# Patient Record
Sex: Female | Born: 1985 | Race: White | Hispanic: No | Marital: Married | Smoking: Never smoker
Health system: Southern US, Community
[De-identification: ages and names within clinical notes are randomized; demographics above are authoritative.]

## PROBLEM LIST (undated history)

## (undated) DIAGNOSIS — O139 Gestational [pregnancy-induced] hypertension without significant proteinuria, unspecified trimester: Secondary | ICD-10-CM

## (undated) DIAGNOSIS — I10 Essential (primary) hypertension: Secondary | ICD-10-CM

## (undated) DIAGNOSIS — O1490 Unspecified pre-eclampsia, unspecified trimester: Secondary | ICD-10-CM

## (undated) DIAGNOSIS — Z3490 Encounter for supervision of normal pregnancy, unspecified, unspecified trimester: Secondary | ICD-10-CM

## (undated) DIAGNOSIS — F32A Depression, unspecified: Secondary | ICD-10-CM

## (undated) DIAGNOSIS — F419 Anxiety disorder, unspecified: Secondary | ICD-10-CM

## (undated) HISTORY — PX: FOOT FRACTURE SURGERY: SHX645

## (undated) HISTORY — PX: EYE SURGERY: SHX253

## (undated) HISTORY — DX: Anxiety disorder, unspecified: F41.9

## (undated) HISTORY — PX: REFRACTIVE SURGERY: SHX103

## (undated) HISTORY — DX: Unspecified pre-eclampsia, unspecified trimester: O14.90

## (undated) HISTORY — PX: OTHER SURGICAL HISTORY: SHX169

## (undated) HISTORY — DX: Essential (primary) hypertension: I10

## (undated) HISTORY — DX: Depression, unspecified: F32.A

## (undated) HISTORY — PX: BRAIN SURGERY: SHX531

## (undated) HISTORY — DX: Encounter for supervision of normal pregnancy, unspecified, unspecified trimester: Z34.90

## (undated) HISTORY — DX: Gestational (pregnancy-induced) hypertension without significant proteinuria, unspecified trimester: O13.9

---
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
## 2004-05-10 ENCOUNTER — Other Ambulatory Visit: Admission: RE | Admit: 2004-05-10 | Discharge: 2004-05-10 | Payer: Self-pay | Admitting: Obstetrics and Gynecology

## 2004-05-10 ENCOUNTER — Other Ambulatory Visit: Admission: RE | Admit: 2004-05-10 | Discharge: 2004-05-10 | Payer: Self-pay | Admitting: Obstetrics & Gynecology

## 2005-05-24 ENCOUNTER — Other Ambulatory Visit: Admission: RE | Admit: 2005-05-24 | Discharge: 2005-05-24 | Payer: Self-pay | Admitting: Obstetrics and Gynecology

## 2006-03-29 ENCOUNTER — Ambulatory Visit (HOSPITAL_COMMUNITY): Admission: RE | Admit: 2006-03-29 | Discharge: 2006-03-29 | Payer: Self-pay | Admitting: Pediatrics

## 2008-10-31 DIAGNOSIS — F419 Anxiety disorder, unspecified: Secondary | ICD-10-CM

## 2008-10-31 HISTORY — DX: Anxiety disorder, unspecified: F41.9

## 2009-07-01 LAB — CONVERTED CEMR LAB: Pap Smear: NORMAL

## 2009-11-06 ENCOUNTER — Ambulatory Visit: Payer: Self-pay | Admitting: Family Medicine

## 2009-11-06 DIAGNOSIS — J209 Acute bronchitis, unspecified: Secondary | ICD-10-CM

## 2009-11-06 HISTORY — DX: Acute bronchitis, unspecified: J20.9

## 2009-11-09 ENCOUNTER — Ambulatory Visit: Payer: Self-pay | Admitting: Family Medicine

## 2010-11-30 NOTE — Assessment & Plan Note (Signed)
Summary: tb test reading/mm  Nurse Visit   Allergies: No Known Drug Allergies  PPD Results    Date of reading: 11/09/2009    Results: < 5mm    Interpretation: negative

## 2010-11-30 NOTE — Assessment & Plan Note (Signed)
Summary: NEW PT EST // RS   Vital Signs:  Patient profile:   25 year old female Menstrual status:  regular LMP:     11/04/2009 Height:      66.75 inches Weight:      177 pounds BMI:     28.03 Temp:     98.9 degrees F oral Pulse rate:   70 / minute Pulse rhythm:   regular Resp:     12 per minute BP sitting:   120 / 90  (left arm) Cuff size:   regular  Vitals Entered By: Sid Falcon LPN (November 06, 2009 2:22 PM)  Nutrition Counseling: Patient's BMI is greater than 25 and therefore counseled on weight management options. CC: New to establish, Tb test LMP (date): 11/04/2009     Menstrual Status regular Enter LMP: 11/04/2009 Last PAP Result normal   History of Present Illness: New patient to establish care. Basically healthy. Currently treated for bronchitis with Zithromax from another clinic. That is improving.  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 History of strabismus eye surgery 1992 1997. Foot reconstruction surgery 2006. Takes no medications. No known drug allergies.  Family history significant for hypertension and type 2 diabetes. Patient is school studying nursing. Plans to go to [place] school. Nonsmoker. Tetanus up-to-date. Her safely vaccine earlier this year. Needs PPD for nursing school.  Preventive Screening-Counseling & Management  Caffeine-Diet-Exercise     Does Patient Exercise: yes  Allergies (verified): No Known Drug Allergies  Past History:  Family History: Last updated: 11/06/2009 Family History Hypertension, parents, grandparents Stroke Diabetes, grandparent  Social History: Last updated: 11/06/2009 Occupation:  Nursing student Single Alcohol use-yes Regular exercise-yes  Past Medical History: Chicken pox  Past Surgical History: Optic glioma tumor removal 1989 Strabismus eye surgery [id number] foot reconstruction 2006  Family History: Family History Hypertension, parents, grandparents Stroke Diabetes,  grandparent  Social History: Occupation:  Theatre stage manager Single Alcohol use-yes Regular exercise-yes Occupation:  employed Does Patient Exercise:  yes  Review of Systems  The patient denies anorexia, fever, weight loss, weight gain, chest pain, syncope, dyspnea on exertion, peripheral edema, prolonged cough, headaches, hemoptysis, abdominal pain, melena, hematochezia, severe indigestion/heartburn, and hematuria.    Physical Exam  General:  Well-developed,well-nourished,in no acute distress; alert,appropriate and cooperative throughout examination Ears:  External ear exam shows no significant lesions or deformities.  Otoscopic examination reveals clear canals, tympanic membranes are intact bilaterally without bulging, retraction, inflammation or discharge. Hearing is grossly normal bilaterally. Nose:  External nasal examination shows no deformity or inflammation. Nasal mucosa are pink and moist without lesions or exudates. Mouth:  Oral mucosa and oropharynx without lesions or exudates.  Teeth in good repair. Neck:  No deformities, masses, or tenderness noted. Lungs:  Normal respiratory effort, chest expands symmetrically. Lungs are clear to auscultation, no crackles or wheezes. Heart:  Normal rate and regular rhythm. S1 and S2 normal without gallop, murmur, click, rub or other extra sounds.   Impression & Recommendations:  Problem # 1:  ACUTE BRONCHITIS (ICD-466.0) resolving.  Problem # 2:  Preventive Health Care (ICD-V70.0) patient needs PPD for nursing school and this will be placed today and read in 72 hours  Other Orders: TB Skin Test [phone]) Admin 1st Vaccine (01027)  Patient Instructions: 1)  return in 72 hours to have PPD read.  Preventive Care Screening  Pap Smear:    Date:  07/01/2009    Results:  normal   Last Tetanus Booster:  Date:  10/31/2004    Results:  Historical     Immunizations Administered:  PPD Skin Test:    Vaccine Type: PPD    Site:  right forearm    Mfr: mantoux    Dose: 0.1 ml    Route: ID    Given by: Sid Falcon LPN    Exp. Date: 02/24/2012    Lot #: [id number]

## 2020-10-31 NOTE — L&D Delivery Note (Signed)
Patient was C/C/+3 and pushed for 5 minutes with epidural.    NSVD  female infant, Apgars 9,9, weight P.   The patient had a midline first degree and R labial lacerations repaired with 2-0 and 3-0 vicryl R. Fundus was firm. EBL was expected amount. Placenta was delivered intact. Vagina was clear.  Delayed cord clamping done for 30-60 seconds while warming baby. Baby was vigorous and doing skin to skin with mother.  Loney Laurence

## 2020-12-21 LAB — OB RESULTS CONSOLE RUBELLA ANTIBODY, IGM: Rubella: IMMUNE

## 2020-12-21 LAB — OB RESULTS CONSOLE HEPATITIS B SURFACE ANTIGEN: Hepatitis B Surface Ag: NEGATIVE

## 2020-12-21 LAB — OB RESULTS CONSOLE ABO/RH: RH Type: POSITIVE

## 2020-12-21 LAB — OB RESULTS CONSOLE RPR: RPR: NONREACTIVE

## 2020-12-21 LAB — OB RESULTS CONSOLE HIV ANTIBODY (ROUTINE TESTING): HIV: NONREACTIVE

## 2020-12-21 LAB — OB RESULTS CONSOLE GC/CHLAMYDIA
Chlamydia: NEGATIVE
Gonorrhea: NEGATIVE

## 2020-12-21 LAB — OB RESULTS CONSOLE ANTIBODY SCREEN: Antibody Screen: NEGATIVE

## 2020-12-28 DIAGNOSIS — O09521 Supervision of elderly multigravida, first trimester: Secondary | ICD-10-CM | POA: Diagnosis not present

## 2020-12-28 DIAGNOSIS — N925 Other specified irregular menstruation: Secondary | ICD-10-CM | POA: Diagnosis not present

## 2020-12-31 ENCOUNTER — Other Ambulatory Visit: Payer: Self-pay

## 2020-12-31 ENCOUNTER — Non-Acute Institutional Stay (HOSPITAL_COMMUNITY)
Admission: RE | Admit: 2020-12-31 | Discharge: 2020-12-31 | Disposition: A | Discharge status: Home / Self Care | Payer: BC Managed Care – PPO | Source: Ambulatory Visit | Attending: Internal Medicine | Admitting: Internal Medicine

## 2020-12-31 DIAGNOSIS — O210 Mild hyperemesis gravidarum: Secondary | ICD-10-CM | POA: Diagnosis present

## 2020-12-31 DIAGNOSIS — Z3A00 Weeks of gestation of pregnancy not specified: Secondary | ICD-10-CM | POA: Diagnosis not present

## 2020-12-31 DIAGNOSIS — O169 Unspecified maternal hypertension, unspecified trimester: Secondary | ICD-10-CM | POA: Diagnosis not present

## 2020-12-31 MED ORDER — LACTATED RINGERS IV BOLUS
1000.0000 mL | Freq: Once | INTRAVENOUS | Status: AC
  Administered 2020-12-31: 1000 mL via INTRAVENOUS

## 2020-12-31 NOTE — Progress Notes (Signed)
PATIENT CARE CENTER NOTE  Diagnosis: Mild hyperemesis gravidarum 021.0   Provider: Derl Barrow, MD   Procedure: IV fluid hydration    Note: Patient received a bolus of Lactated ringers (1 of 6) over 1 hour via PIV. Patient tolerated well. Vitals signs remained stable. Discharge instructions given. Patient to come back once a week for a total of 6 weeks per provider's order. Patient alert, oriented and ambulatory at discharge.

## 2020-12-31 NOTE — Discharge Instructions (Signed)
Hyperemesis Gravidarum Hyperemesis gravidarum is a severe form of nausea and vomiting that happens during pregnancy. Hyperemesis is worse than morning sickness. It may cause you to have nausea or vomiting all day for many days. It may keep you from eating and drinking enough food and liquids, which can lead to dehydration, malnutrition, and weight loss. Hyperemesis usually occurs during the first half (the first 20 weeks) of pregnancy. It often goes away once a woman is in her second half of pregnancy. However, sometimes hyperemesis continues through an entire pregnancy. What are the causes? The cause of this condition is not known. It may be associated with:  Changes in hormones in the body during pregnancy.  Changes in the gastrointestinal system.  Genetic or inherited conditions. What are the signs or symptoms? Symptoms of this condition include:  Severe nausea and vomiting that does not go away.  Problems keeping food down.  Weight loss.  Loss of body fluid (dehydration).  Loss of appetite. You may have no desire to eat or you may not like the food you have previously enjoyed. How is this diagnosed? This condition may be diagnosed based on your medical history, your symptoms, and a physical exam. You may also have other tests, including:  Blood tests.  Urine tests.  Blood pressure tests.  Ultrasound to look for problems with the placenta or to check if you are pregnant with more than one baby. How is this treated? This condition is managed by controlling symptoms. This may include:  Following an eating plan. This can help to lessen nausea and vomiting.  Treatments that do not use medicine. These include acupressure bracelets, hypnosis, and eating or drinking foods or fluids that contain ginger, ginger ale, or ginger tea.  Taking prescription medicine or over-the-counter medicine as told by your health care provider.  Continuing to take prenatal vitamins. You may need to  change what kind you take and when you take them. Follow your health care provider's instructions about prenatal vitamins. An eating plan and medicines are often used together to help control symptoms. If medicines do not help relieve nausea and vomiting, you may need to receive fluids through an IV at the hospital. Follow these instructions at home: To help relieve your symptoms, listen to your body. Everyone is different and has different preferences. Find what works best for you. Here are some things you can try to help relieve your symptoms: Meals and snacks  Eat 5-6 small meals daily instead of 3 large meals. Eating small meals and snacks can help you avoid an empty stomach.  Before getting out of bed, eat a couple of crackers to avoid moving around on an empty stomach.  Eat a protein-rich snack before bed. Examples include cheese and crackers, or a peanut butter sandwich made with 1 slice of whole-wheat bread and 1 tsp (5 g) of peanut butter.  Eat and drink slowly.  Try eating starchy foods as these are usually tolerated well. Examples include cereal, toast, bread, potatoes, pasta, rice, and pretzels.  Eat at least one serving of protein with your meals and snacks. Protein options include lean meats, poultry, seafood, beans, nuts, nut butters, eggs, cheese, and yogurt.  Eat or suck on things that have ginger in them. It may help to relieve nausea. Add  tsp (0.44 g) ground ginger to hot tea, or choose ginger tea.   Fluids It is important to stay hydrated. Try to:  Drink small amounts of fluids often.  Drink fluids 30 minutes   before or after a meal to help lessen the feeling of a full stomach.  Drink 100% fruit juice or an electrolyte drink. An electrolyte drink contains sodium, potassium, and chloride.  Drink fluids that are cold, clear, and carbonated or sour. These include lemonade, ginger ale, lemon-lime soda, ice water, and sparkling water. Things to avoid Avoid the  following:  Eating foods that trigger your symptoms. These may include spicy foods, coffee, high-fat foods, very sweet foods, and acidic foods.  Drinking more than 1 cup of fluid at a time.  Skipping meals. Nausea can be more intense on an empty stomach. If you cannot tolerate food, do not force it. Try sucking on ice chips or other frozen items and make up for missed calories later.  Lying down within 2 hours after eating.  Being exposed to environmental triggers. These may include food smells, smoky rooms, closed spaces, rooms with strong smells, warm or humid places, overly loud and noisy rooms, and rooms with motion or flickering lights. Try eating meals in a well-ventilated area that is free of strong smells.  Making quick and sudden changes in your movement.  Taking iron pills and multivitamins that contain iron. If you take prescription iron pills, do not stop taking them unless your health care provider approves.  Preparing food. The smell of food can spoil your appetite or trigger nausea. General instructions  Brush your teeth or use a mouth rinse after meals.  Take over-the-counter and prescription medicines only as told by your health care provider.  Follow instructions from your health care provider about eating or drinking restrictions.  Talk with your health care provider about starting a supplement of vitamin B6.  Continue to take your prenatal vitamins as told by your health care provider. If you are having trouble taking your prenatal vitamins, talk with your health care provider about other options.  Keep all follow-up visits. This is important. Follow-up visits include prenatal visits. Contact a health care provider if:  You have pain in your abdomen.  You have a severe headache.  You have vision problems.  You are losing weight.  You feel weak or dizzy.  You cannot eat or drink without vomiting, especially if this goes on for a full day. Get help right  away if:  You cannot drink fluids without vomiting.  You vomit blood.  You have constant nausea and vomiting.  You are very weak.  You faint.  You have a fever and your symptoms suddenly get worse. Summary  Hyperemesis gravidarum is a severe form of nausea and vomiting that happens during pregnancy.  Making some changes to your eating habits may help relieve nausea and vomiting.  This condition may be managed with lifestyle changes and medicines as prescribed by your health care provider.  If medicines do not help relieve nausea and vomiting, you may need to receive fluids through an IV at the hospital. This information is not intended to replace advice given to you by your health care provider. Make sure you discuss any questions you have with your health care provider. Document Revised: 05/11/2020 Document Reviewed: 05/11/2020 Elsevier Patient Education  2021 [employer]  

## 2021-01-06 ENCOUNTER — Other Ambulatory Visit: Payer: Self-pay

## 2021-01-06 ENCOUNTER — Encounter: Payer: Self-pay | Admitting: Cardiology

## 2021-01-06 ENCOUNTER — Ambulatory Visit (INDEPENDENT_AMBULATORY_CARE_PROVIDER_SITE_OTHER): Payer: BC Managed Care – PPO | Admitting: Cardiology

## 2021-01-06 VITALS — BP 120/82 | HR 89 | Ht 67.0 in | Wt 171.0 lb

## 2021-01-06 DIAGNOSIS — O1490 Unspecified pre-eclampsia, unspecified trimester: Secondary | ICD-10-CM | POA: Insufficient documentation

## 2021-01-06 DIAGNOSIS — O1493 Unspecified pre-eclampsia, third trimester: Secondary | ICD-10-CM | POA: Diagnosis not present

## 2021-01-06 DIAGNOSIS — Z8759 Personal history of other complications of pregnancy, childbirth and the puerperium: Secondary | ICD-10-CM | POA: Diagnosis not present

## 2021-01-06 DIAGNOSIS — Z3490 Encounter for supervision of normal pregnancy, unspecified, unspecified trimester: Secondary | ICD-10-CM | POA: Insufficient documentation

## 2021-01-06 DIAGNOSIS — M7989 Other specified soft tissue disorders: Secondary | ICD-10-CM

## 2021-01-06 DIAGNOSIS — Z3A10 10 weeks gestation of pregnancy: Secondary | ICD-10-CM

## 2021-01-06 DIAGNOSIS — I10 Essential (primary) hypertension: Secondary | ICD-10-CM

## 2021-01-06 HISTORY — DX: Encounter for supervision of normal pregnancy, unspecified, unspecified trimester: Z34.90

## 2021-01-06 NOTE — Progress Notes (Signed)
Cardiology Consultation:    Date:  01/06/2021   ID:  Blima Dessert, DOB 1986/03/14, MRN [id number]  PCP:  Patient, No Pcp Per  Cardiologist:  Gypsy Balsam, MD   Referring MD: Charlett Nose, MD   Chief Complaint  Patient presents with  . OB referral due to hx of preclampsia    History of Present Illness:    Alicia Nguyen is a 35 y.o. female who is being seen today for the evaluation of preeclampsia during the first pregnancy at the request of Charlett Nose, MD.  She is a 35 years old woman who is pregnant with a second child, last menstrual period was 10/25/2020, first pregnancy was complicated by preeclampsia with severe features.  Apparently her blood pressure was very high and done when she was [redacted] weeks pregnant she was induced it likely it resulted with the bone of healthy child.  Still has quite one more interesting she told me about 4 years ago she was noted to have high blood pressure was put on some medication taking this for a while and then after that medication has been discontinued.  None noted first pregnancy she ended up having high blood pressure.  I do not have information about potentially having proteinuria, after delivery she had no more problem with the blood pressure.  However she is pregnant now with a second child, likely her blood pressure seems to be maintaining on the normal range, she having a lot of problems that include nausea and vomiting.  During the first pregnancy she had very similar complications.  It resulted actually and significant weight loss when she was pregnant. She denies have any cardiac complaints, there is no chest pain tightness squeezing pressure burning chest no palpitations no dizziness no swelling of lower extremities.  She is not exercising on the regular basis but she is trying to be active and of course now with the nausea and vomiting it becomes somewhat difficult.  She was referred to [place] for follow-up since she does have  preeclampsia during the first pregnancy. She is also complaining of having pain in the left calf pain started few months ago and is still present she is very worried about potentially having DVT.  Past Medical History:  Diagnosis Date  . Hypertension   . Preeclampsia   . Pregnant     Past Surgical History:  Procedure Laterality Date  . FOOT FRACTURE SURGERY    . Optic gioma removal    . REFRACTIVE SURGERY      Current Medications: Current Meds  Medication Sig  . ondansetron (ZOFRAN) 4 MG tablet Take 1 tablet by mouth as needed for nausea/vomiting.  . Prenatal Vit-DSS-Fe Cbn-FA (PRENATAL AD PO) Take 1 tablet by mouth daily.  . promethazine (PHENERGAN) 25 MG tablet Take 1 tablet by mouth as needed for nausea/vomiting.  . valACYclovir (VALTREX) 500 MG tablet Take 500 mg by mouth 2 (two) times daily.     Allergies:   Patient has no known allergies.   Social History   Socioeconomic History  . Marital status: Married    Spouse name: Not on file  . Number of children: Not on file  . Years of education: Not on file  . Highest education level: Not on file  Occupational History  . Not on file  Tobacco Use  . Smoking status: Never Smoker  . Smokeless tobacco: Never Used  Substance and Sexual Activity  . Alcohol use: Not Currently  . Drug use: Never  .  Sexual activity: Yes  Other Topics Concern  . Not on file  Social History Narrative  . Not on file   Social Determinants of Health   Financial Resource Strain: Not on file  Food Insecurity: Not on file  Transportation Needs: Not on file  Physical Activity: Not on file  Stress: Not on file  Social Connections: Not on file     Family History: The patient's family history includes CVA in her maternal grandmother; Cervical cancer in her maternal aunt; Colon polyps in her father; Dementia in her maternal aunt; Diabetes in her maternal grandmother; Hypertension in her father; Skin cancer in her maternal grandmother. ROS:    Please see the history of present illness.    All 14 point review of systems negative except as described per history of present illness.  EKGs/Labs/Other Studies Reviewed:    The following studies were reviewed today:   EKG:  EKG is  ordered today.  The ekg ordered today demonstrates normal sinus rhythm, normal P interval, normal QS complex duration morphology no ST segment changes.  No evidence of LVH  Recent Labs: No results found for requested labs within last 8760 hours.  Recent Lipid Panel No results found for: CHOL, TRIG, HDL, CHOLHDL, VLDL, LDLCALC, LDLDIRECT  Physical Exam:    VS:  BP 120/82 (BP Location: Right Arm, Patient Position: Sitting)   Pulse 89   Ht 5\' 7"  (1.702 m)   Wt 171 lb (77.6 kg)   SpO2 98%   BMI 26.78 kg/m     Wt Readings from Last 3 Encounters:  01/06/21 171 lb (77.6 kg)     GEN:  Well nourished, well developed in no acute distress HEENT: Normal NECK: No JVD; No carotid bruits LYMPHATICS: No lymphadenopathy CARDIAC: RRR, no murmurs, no rubs, no gallops RESPIRATORY:  Clear to auscultation without rales, wheezing or rhonchi  ABDOMEN: Soft, non-tender, non-distended MUSCULOSKELETAL:  No edema; No deformity  SKIN: Warm and dry NEUROLOGIC:  Alert and oriented x 3 PSYCHIATRIC:  Normal affect   ASSESSMENT:    1. Leg swelling   2. History of pre-eclampsia   3. Essential hypertension   4. Pre-eclampsia in third trimester   5. [redacted] weeks gestation of pregnancy    PLAN:    In order of problems listed above:  1. History of preeclampsia during the first pregnancy.  Likely her blood pressure is still maintaining.  She does not have any swelling of lower extremities.  She did have a reticulocyte Chem-7 ordered as well as protein in the urine by OB/GYN.  I do not have results of this test yet.  She was also instructed to start taking aspirin 12 weeks of pregnancy.  I will ask her to have an echocardiogram done to assess left ventricle ejection fraction,  EKG did not show any evidence of left ventricle hypertrophy.  I did instructed her about checking her blood pressure on the regular basis which she already does.  And let 03/08/21 know if blood pressures start going up and if you start seeing some foamy urine which could indicate he had protein in the urine.  I will schedule her to see our [facility] on the next incoming appointment. 2. Essential hypertension blood pressures within normal range right now we will continue monitoring. 3. Pregnancy so far no complications.  She does have rough time however with nausea and vomiting. 4. She is concerned about pain in the left calf.  She said this pain started in November  she did notice some swelling and there is a lipid tenderness over there.  She is very concerned about potentially having blood clot in her calf.  I am I asked her to have ultrasounds of the left lower extremities make sure there is no DVT in that extremities however overall I have rather low level suspicion.  D-dimer will not be helpful in her is since she is pregnant and can falsely abnormal D-dimer.   Medication Adjustments/Labs and Tests Ordered: Current medicines are reviewed at length with the patient today.  Concerns regarding medicines are outlined above.  Orders Placed This Encounter  Procedures  . ECHOCARDIOGRAM COMPLETE  . VAS [place] LOWER EXTREMITY VENOUS (DVT)   No orders of the defined types were placed in this encounter.   Signed, Georgeanna Lea, MD, [facility]. 01/06/2021 11:49 AM     [facility]

## 2021-01-06 NOTE — Addendum Note (Signed)
Addended by: Heywood Bene on: 01/06/2021 01:30 PM   Modules accepted: Orders

## 2021-01-06 NOTE — Patient Instructions (Signed)
Medication Instructions:  Your physician recommends that you continue on your current medications as directed. Please refer to the Current Medication list given to you today.  *If you need a refill on your cardiac medications before your next appointment, please call your pharmacy*   Lab Work: None If you have labs (blood work) drawn today and your tests are completely normal, you will receive your results only by: Marland Kitchen MyChart Message (if you have MyChart) OR . A paper copy in the mail If you have any lab test that is abnormal or we need to change your treatment, we will call you to review the results.   Testing/Procedures: Your physician has requested that you have an echocardiogram. Echocardiography is a painless test that uses sound waves to create images of your heart. It provides your doctor with information about the size and shape of your heart and how well your heart's chambers and valves are working. This procedure takes approximately one hour. There are no restrictions for this procedure.  Your physician has requested that you have a lower or upper extremity venous duplex. This test is an ultrasound of the veins in the legs or arms. It looks at venous blood flow that carries blood from the heart to the legs or arms. Allow one hour for a Lower Venous exam. Allow thirty minutes for an Upper Venous exam. There are no restrictions or special instructions.     Follow-Up: At [facility], you and your health needs are our priority.  As part of our continuing mission to provide you with exceptional heart care, we have created designated Provider Care Teams.  These Care Teams include your primary Cardiologist (physician) and Advanced Practice Providers (APPs -  Physician Assistants and Nurse Practitioners) who all work together to provide you with the care you need, when you need it.  We recommend signing up for the patient portal called "MyChart".  Sign up information is provided on this  After Visit Summary.  MyChart is used to connect with patients for Virtual Visits (Telemedicine).  Patients are able to view lab/test results, encounter notes, upcoming appointments, etc.  Non-urgent messages can be sent to your provider as well.   To learn more about what you can do with MyChart, go to [email or web address].    Your next appointment:   2 month(s)  The format for your next appointment:   In Person  Provider:   Thomasene Ripple, DO   Other Instructions   Echocardiogram An echocardiogram is a test that uses sound waves (ultrasound) to produce images of the heart. Images from an echocardiogram can provide important information about:  Heart size and shape.  The size and thickness and movement of your heart's walls.  Heart muscle function and strength.  Heart valve function or if you have stenosis. Stenosis is when the heart valves are too narrow.  If blood is flowing backward through the heart valves (regurgitation).  A tumor or infectious growth around the heart valves.  Areas of heart muscle that are not working well because of poor blood flow or injury from a heart attack.  Aneurysm detection. An aneurysm is a weak or damaged part of an artery wall. The wall bulges out from the normal force of blood pumping through the body. Tell a health care provider about:  Any allergies you have.  All medicines you are taking, including vitamins, herbs, eye drops, creams, and over-the-counter medicines.  Any blood disorders you have.  Any surgeries you have had.  Any  medical conditions you have.  Whether you are pregnant or may be pregnant. What are the risks? Generally, this is a safe test. However, problems may occur, including an allergic reaction to dye (contrast) that may be used during the test. What happens before the test? No specific preparation is needed. You may eat and drink normally. What happens during the test?  You will take off your clothes from  the waist up and put on a hospital gown.  Electrodes or electrocardiogram (ECG)patches may be placed on your chest. The electrodes or patches are then connected to a device that monitors your heart rate and rhythm.  You will lie down on a table for an ultrasound exam. A gel will be applied to your chest to help sound waves pass through your skin.  A handheld device, called a transducer, will be pressed against your chest and moved over your heart. The transducer produces sound waves that travel to your heart and bounce back (or "echo" back) to the transducer. These sound waves will be captured in real-time and changed into images of your heart that can be viewed on a video monitor. The images will be recorded on a computer and reviewed by your health care provider.  You may be asked to change positions or hold your breath for a short time. This makes it easier to get different views or better views of your heart.  In some cases, you may receive contrast through an IV in one of your veins. This can improve the quality of the pictures from your heart. The procedure may vary among health care providers and hospitals.   What can I expect after the test? You may return to your normal, everyday life, including diet, activities, and medicines, unless your health care provider tells you not to do that. Follow these instructions at home:  It is up to you to get the results of your test. Ask your health care provider, or the department that is doing the test, when your results will be ready.  Keep all follow-up visits. This is important. Summary  An echocardiogram is a test that uses sound waves (ultrasound) to produce images of the heart.  Images from an echocardiogram can provide important information about the size and shape of your heart, heart muscle function, heart valve function, and other possible heart problems.  You do not need to do anything to prepare before this test. You may eat and  drink normally.  After the echocardiogram is completed, you may return to your normal, everyday life, unless your health care provider tells you not to do that. This information is not intended to replace advice given to you by your health care provider. Make sure you discuss any questions you have with your health care provider. Document Revised: 06/09/2020 Document Reviewed: 06/09/2020 Elsevier Patient Education  2021 [employer].

## 2021-01-07 ENCOUNTER — Other Ambulatory Visit: Payer: Self-pay | Admitting: Obstetrics and Gynecology

## 2021-01-07 ENCOUNTER — Non-Acute Institutional Stay (HOSPITAL_COMMUNITY)
Admission: RE | Admit: 2021-01-07 | Discharge: 2021-01-07 | Disposition: A | Discharge status: Home / Self Care | Payer: BC Managed Care – PPO | Source: Ambulatory Visit | Attending: Internal Medicine | Admitting: Internal Medicine

## 2021-01-07 DIAGNOSIS — O210 Mild hyperemesis gravidarum: Secondary | ICD-10-CM | POA: Diagnosis not present

## 2021-01-07 MED ORDER — LACTATED RINGERS IV BOLUS
2000.0000 mL | Freq: Once | INTRAVENOUS | Status: AC
  Administered 2021-01-07: 2000 mL via INTRAVENOUS

## 2021-01-07 MED ORDER — LACTATED RINGERS IV BOLUS: 1000.0000 mL | Freq: Once | INTRAVENOUS | Status: DC

## 2021-01-07 MED ORDER — LACTATED RINGERS IV BOLUS: 2000.0000 mL | INTRAVENOUS | Status: AC

## 2021-01-07 NOTE — Progress Notes (Signed)
Patient Care Center Noted  Diagnosis : Mild hyperemesis gravidarum    Provider: Derl Barrow MD  Procedure: IV fluid hydration  Note: Patient received a 2L bolus of Lactated Ringer's (2 of 6) over 2 hours via PIV. New order obtained from pt's OBGYN office. Patient tolerated well. Vitals signs remained stable. Pt declined AVS. Patient states she has already scheduled weekly fluid infusions for a total of 6 infusions. Patient alert, oriented and ambulatory at discharge.

## 2021-01-14 ENCOUNTER — Inpatient Hospital Stay (HOSPITAL_COMMUNITY)
Admission: RE | Admit: 2021-01-14 | Discharge: 2021-01-14 | Disposition: A | Discharge status: Home / Self Care | Payer: BC Managed Care – PPO | Source: Ambulatory Visit

## 2021-01-18 ENCOUNTER — Non-Acute Institutional Stay (HOSPITAL_COMMUNITY)
Admission: RE | Admit: 2021-01-18 | Discharge: 2021-01-18 | Disposition: A | Discharge status: Home / Self Care | Payer: BC Managed Care – PPO | Source: Ambulatory Visit | Attending: Internal Medicine | Admitting: Internal Medicine

## 2021-01-18 ENCOUNTER — Other Ambulatory Visit: Payer: Self-pay

## 2021-01-18 DIAGNOSIS — Z3A00 Weeks of gestation of pregnancy not specified: Secondary | ICD-10-CM | POA: Insufficient documentation

## 2021-01-18 DIAGNOSIS — O210 Mild hyperemesis gravidarum: Secondary | ICD-10-CM | POA: Insufficient documentation

## 2021-01-18 DIAGNOSIS — Z369 Encounter for antenatal screening, unspecified: Secondary | ICD-10-CM | POA: Diagnosis not present

## 2021-01-18 MED ORDER — LACTATED RINGERS IV BOLUS
2000.0000 mL | Freq: Once | INTRAVENOUS | Status: AC
  Administered 2021-01-18: 2000 mL via INTRAVENOUS

## 2021-01-18 NOTE — Progress Notes (Signed)
PATIENT CARE CENTER NOTE  Diagnosis: Mild hyperemesis gravidarum   Provider: Derl Barrow MD   Procedure: IV fluid hydration   Note: Patient received 2 L of Lactated ringer's solution. Tolerated well, vitals stable, declined printed avs, alert, oriented and ambulatory at the time of discharge.

## 2021-01-18 NOTE — Discharge Instructions (Signed)
Rehydration, Adult Rehydration is the replacement of body fluids, salts, and minerals (electrolytes) that are lost during dehydration. Dehydration is when there is not enough water or other fluids in the body. This happens when you lose more fluids than you take in. Common causes of dehydration include:  Not drinking enough fluids. This can occur when you are ill or doing activities that require a lot of energy, especially in hot weather.  Conditions that cause loss of water or other fluids, such as diarrhea, vomiting, sweating, or urinating a lot.  Other illnesses, such as fever or infection.  Certain medicines, such as those that remove excess fluid from the body (diuretics). Symptoms of mild or moderate dehydration may include thirst, dry lips and mouth, and dizziness. Symptoms of severe dehydration may include increased heart rate, confusion, fainting, and not urinating. For severe dehydration, you may need to get fluids through an IV at the hospital. For mild or moderate dehydration, you can usually rehydrate at home by drinking certain fluids as told by your health care provider. What are the risks? Generally, rehydration is safe. However, taking in too much fluid (overhydration) can be a problem. This is rare. Overhydration can cause an electrolyte imbalance, kidney failure, or a decrease in salt (sodium) levels in the body. Supplies needed You will need an oral rehydration solution (ORS) if your health care provider tells you to use one. This is a drink to treat dehydration. It can be found in pharmacies and retail stores. How to rehydrate Fluids Follow instructions from your health care provider for rehydration. The kind of fluid and the amount you should drink depend on your condition. In general, you should choose drinks that you prefer.  If told by your health care provider, drink an ORS. ? Make an ORS by following instructions on the package. ? Start by drinking small amounts,  about  cup (120 mL) every 5-10 minutes. ? Slowly increase how much you drink until you have taken the amount recommended by your health care provider.  Drink enough clear fluids to keep your urine pale yellow. If you were told to drink an ORS, finish it first, then start slowly drinking other clear fluids. Drink fluids such as: ? Water. This includes sparkling water and flavored water. Drinking only water can lead to having too little sodium in your body (hyponatremia). Follow the advice of your health care provider. ? Water from ice chips you suck on. ? Fruit juice with water you add to it (diluted). ? Sports drinks. ? Hot or cold herbal teas. ? Broth-based soups. ? Milk or milk products. Food Follow instructions from your health care provider about what to eat while you rehydrate. Your health care provider may recommend that you slowly begin eating regular foods in small amounts.  Eat foods that contain a healthy balance of electrolytes, such as bananas, oranges, potatoes, tomatoes, and spinach.  Avoid foods that are greasy or contain a lot of sugar. In some cases, you may get nutrition through a feeding tube that is passed through your nose and into your stomach (nasogastric tube, or NG tube). This may be done if you have uncontrolled vomiting or diarrhea.   Beverages to avoid Certain beverages may make dehydration worse. While you rehydrate, avoid drinking alcohol.   How to tell if you are recovering from dehydration You may be recovering from dehydration if:  You are urinating more often than before you started rehydrating.  Your urine is pale yellow.  Your energy level   improves.  You vomit less frequently.  You have diarrhea less frequently.  Your appetite improves or returns to normal.  You feel less dizzy or less light-headed.  Your skin tone and color start to look more normal. Follow these instructions at home:  Take over-the-counter and prescription medicines only  as told by your health care provider.  Do not take sodium tablets. Doing this can lead to having too much sodium in your body (hypernatremia). Contact a health care provider if:  You continue to have symptoms of mild or moderate dehydration, such as: ? Thirst. ? Dry lips. ? Slightly dry mouth. ? Dizziness. ? Dark urine or less urine than normal. ? Muscle cramps.  You continue to vomit or have diarrhea. Get help right away if you:  Have symptoms of dehydration that get worse.  Have a fever.  Have a severe headache.  Have been vomiting and the following happens: ? Your vomiting gets worse or does not go away. ? Your vomit includes blood or green matter (bile). ? You cannot eat or drink without vomiting.  Have problems with urination or bowel movements, such as: ? Diarrhea that gets worse or does not go away. ? Blood in your stool (feces). This may cause stool to look black and tarry. ? Not urinating, or urinating only a small amount of very dark urine, within 6-8 hours.  Have trouble breathing.  Have symptoms that get worse with treatment. These symptoms may represent a serious problem that is an emergency. Do not wait to see if the symptoms will go away. Get medical help right away. Call your local emergency services (911 in the [place] Do not drive yourself to the hospital. Summary  Rehydration is the replacement of body fluids and minerals (electrolytes) that are lost during dehydration.  Follow instructions from your health care provider for rehydration. The kind of fluid and amount you should drink depend on your condition.  Slowly increase how much you drink until you have taken the amount recommended by your health care provider.  Contact your health care provider if you continue to show signs of mild or moderate dehydration. This information is not intended to replace advice given to you by your health care provider. Make sure you discuss any questions you have with  your health care provider. Document Revised: 12/18/2019 Document Reviewed: 10/28/2019 Elsevier Patient Education  2021 [employer]vier Inc[employer]  

## 2021-01-21 ENCOUNTER — Inpatient Hospital Stay (HOSPITAL_COMMUNITY): Admission: RE | Admit: 2021-01-21 | Payer: BC Managed Care – PPO | Source: Ambulatory Visit

## 2021-01-21 ENCOUNTER — Ambulatory Visit: Payer: BC Managed Care – PPO | Admitting: Medical

## 2021-01-25 ENCOUNTER — Other Ambulatory Visit: Payer: Self-pay

## 2021-01-25 ENCOUNTER — Ambulatory Visit (HOSPITAL_COMMUNITY)
Admission: RE | Admit: 2021-01-25 | Discharge: 2021-01-25 | Disposition: A | Discharge status: Home / Self Care | Payer: BC Managed Care – PPO | Source: Ambulatory Visit | Attending: Internal Medicine | Admitting: Internal Medicine

## 2021-01-25 DIAGNOSIS — O210 Mild hyperemesis gravidarum: Secondary | ICD-10-CM | POA: Diagnosis present

## 2021-01-25 MED ORDER — LACTATED RINGERS IV BOLUS
2000.0000 mL | Freq: Once | INTRAVENOUS | Status: AC
  Administered 2021-01-25: 2000 mL via INTRAVENOUS

## 2021-01-25 NOTE — Progress Notes (Signed)
PATIENT CARE CENTER NOTE  Diagnosis: Mild hyperemesis gravidarum   Provider: Michelle Marinone MD   Procedure: IV fluid hydration   Note: Patient received 2 L of Lactated ringer's solution. Tolerated well, vitals stable, declined printed avs, alert, oriented and ambulatory at the time of discharge.  

## 2021-01-25 NOTE — Discharge Instructions (Signed)
Rehydration, Adult Rehydration is the replacement of body fluids, salts, and minerals (electrolytes) that are lost during dehydration. Dehydration is when there is not enough water or other fluids in the body. This happens when you lose more fluids than you take in. Common causes of dehydration include:  Not drinking enough fluids. This can occur when you are ill or doing activities that require a lot of energy, especially in hot weather.  Conditions that cause loss of water or other fluids, such as diarrhea, vomiting, sweating, or urinating a lot.  Other illnesses, such as fever or infection.  Certain medicines, such as those that remove excess fluid from the body (diuretics). Symptoms of mild or moderate dehydration may include thirst, dry lips and mouth, and dizziness. Symptoms of severe dehydration may include increased heart rate, confusion, fainting, and not urinating. For severe dehydration, you may need to get fluids through an IV at the hospital. For mild or moderate dehydration, you can usually rehydrate at home by drinking certain fluids as told by your health care provider. What are the risks? Generally, rehydration is safe. However, taking in too much fluid (overhydration) can be a problem. This is rare. Overhydration can cause an electrolyte imbalance, kidney failure, or a decrease in salt (sodium) levels in the body. Supplies needed You will need an oral rehydration solution (ORS) if your health care provider tells you to use one. This is a drink to treat dehydration. It can be found in pharmacies and retail stores. How to rehydrate Fluids Follow instructions from your health care provider for rehydration. The kind of fluid and the amount you should drink depend on your condition. In general, you should choose drinks that you prefer.  If told by your health care provider, drink an ORS. ? Make an ORS by following instructions on the package. ? Start by drinking small amounts,  about  cup (120 mL) every 5-10 minutes. ? Slowly increase how much you drink until you have taken the amount recommended by your health care provider.  Drink enough clear fluids to keep your urine pale yellow. If you were told to drink an ORS, finish it first, then start slowly drinking other clear fluids. Drink fluids such as: ? Water. This includes sparkling water and flavored water. Drinking only water can lead to having too little sodium in your body (hyponatremia). Follow the advice of your health care provider. ? Water from ice chips you suck on. ? Fruit juice with water you add to it (diluted). ? Sports drinks. ? Hot or cold herbal teas. ? Broth-based soups. ? Milk or milk products. Food Follow instructions from your health care provider about what to eat while you rehydrate. Your health care provider may recommend that you slowly begin eating regular foods in small amounts.  Eat foods that contain a healthy balance of electrolytes, such as bananas, oranges, potatoes, tomatoes, and spinach.  Avoid foods that are greasy or contain a lot of sugar. In some cases, you may get nutrition through a feeding tube that is passed through your nose and into your stomach (nasogastric tube, or NG tube). This may be done if you have uncontrolled vomiting or diarrhea.   Beverages to avoid Certain beverages may make dehydration worse. While you rehydrate, avoid drinking alcohol.   How to tell if you are recovering from dehydration You may be recovering from dehydration if:  You are urinating more often than before you started rehydrating.  Your urine is pale yellow.  Your energy level   improves.  You vomit less frequently.  You have diarrhea less frequently.  Your appetite improves or returns to normal.  You feel less dizzy or less light-headed.  Your skin tone and color start to look more normal. Follow these instructions at home:  Take over-the-counter and prescription medicines only  as told by your health care provider.  Do not take sodium tablets. Doing this can lead to having too much sodium in your body (hypernatremia). Contact a health care provider if:  You continue to have symptoms of mild or moderate dehydration, such as: ? Thirst. ? Dry lips. ? Slightly dry mouth. ? Dizziness. ? Dark urine or less urine than normal. ? Muscle cramps.  You continue to vomit or have diarrhea. Get help right away if you:  Have symptoms of dehydration that get worse.  Have a fever.  Have a severe headache.  Have been vomiting and the following happens: ? Your vomiting gets worse or does not go away. ? Your vomit includes blood or green matter (bile). ? You cannot eat or drink without vomiting.  Have problems with urination or bowel movements, such as: ? Diarrhea that gets worse or does not go away. ? Blood in your stool (feces). This may cause stool to look black and tarry. ? Not urinating, or urinating only a small amount of very dark urine, within 6-8 hours.  Have trouble breathing.  Have symptoms that get worse with treatment. These symptoms may represent a serious problem that is an emergency. Do not wait to see if the symptoms will go away. Get medical help right away. Call your local emergency services (911 in the [place] Do not drive yourself to the hospital. Summary  Rehydration is the replacement of body fluids and minerals (electrolytes) that are lost during dehydration.  Follow instructions from your health care provider for rehydration. The kind of fluid and amount you should drink depend on your condition.  Slowly increase how much you drink until you have taken the amount recommended by your health care provider.  Contact your health care provider if you continue to show signs of mild or moderate dehydration. This information is not intended to replace advice given to you by your health care provider. Make sure you discuss any questions you have with  your health care provider. Document Revised: 12/18/2019 Document Reviewed: 10/28/2019 Elsevier Patient Education  2021 [employer]vier Inc[employer]  

## 2021-01-28 ENCOUNTER — Other Ambulatory Visit: Payer: Self-pay

## 2021-01-28 ENCOUNTER — Encounter (HOSPITAL_COMMUNITY): Payer: Self-pay

## 2021-01-28 ENCOUNTER — Encounter (HOSPITAL_COMMUNITY): Payer: BC Managed Care – PPO

## 2021-02-03 ENCOUNTER — Telehealth: Payer: Self-pay | Admitting: Cardiology

## 2021-02-03 ENCOUNTER — Ambulatory Visit (HOSPITAL_BASED_OUTPATIENT_CLINIC_OR_DEPARTMENT_OTHER)
Admission: RE | Admit: 2021-02-03 | Discharge: 2021-02-03 | Disposition: A | Discharge status: Home / Self Care | Payer: BC Managed Care – PPO | Source: Ambulatory Visit | Attending: Cardiology | Admitting: Cardiology

## 2021-02-03 ENCOUNTER — Other Ambulatory Visit: Payer: Self-pay

## 2021-02-03 DIAGNOSIS — Z8759 Personal history of other complications of pregnancy, childbirth and the puerperium: Secondary | ICD-10-CM | POA: Insufficient documentation

## 2021-02-03 LAB — ECHOCARDIOGRAM COMPLETE
Area-P 1/2: 6.37 cm2
S' Lateral: 3.26 cm

## 2021-02-03 NOTE — Telephone Encounter (Signed)
Alicia Nguyen is calling stating she is returning a call from [facility] in regards to her results. Please advise.

## 2021-02-03 NOTE — Telephone Encounter (Signed)
Patient informed of results.  

## 2021-02-04 ENCOUNTER — Encounter (HOSPITAL_COMMUNITY): Payer: BC Managed Care – PPO

## 2021-02-15 ENCOUNTER — Other Ambulatory Visit: Payer: Self-pay

## 2021-02-15 ENCOUNTER — Ambulatory Visit (HOSPITAL_BASED_OUTPATIENT_CLINIC_OR_DEPARTMENT_OTHER)
Admission: RE | Admit: 2021-02-15 | Discharge: 2021-02-15 | Disposition: A | Discharge status: Home / Self Care | Payer: BC Managed Care – PPO | Source: Ambulatory Visit | Attending: Cardiology | Admitting: Cardiology

## 2021-02-15 DIAGNOSIS — M7989 Other specified soft tissue disorders: Secondary | ICD-10-CM | POA: Insufficient documentation

## 2021-02-16 DIAGNOSIS — Z369 Encounter for antenatal screening, unspecified: Secondary | ICD-10-CM | POA: Diagnosis not present

## 2021-02-16 DIAGNOSIS — Z3482 Encounter for supervision of other normal pregnancy, second trimester: Secondary | ICD-10-CM | POA: Diagnosis not present

## 2021-02-18 ENCOUNTER — Other Ambulatory Visit: Payer: Self-pay | Admitting: Obstetrics and Gynecology

## 2021-02-18 ENCOUNTER — Encounter: Payer: Self-pay | Admitting: Physician Assistant

## 2021-02-18 DIAGNOSIS — I10 Essential (primary) hypertension: Secondary | ICD-10-CM

## 2021-02-18 DIAGNOSIS — Z3A16 16 weeks gestation of pregnancy: Secondary | ICD-10-CM

## 2021-02-18 DIAGNOSIS — O09522 Supervision of elderly multigravida, second trimester: Secondary | ICD-10-CM

## 2021-02-23 ENCOUNTER — Other Ambulatory Visit: Payer: Self-pay

## 2021-02-23 DIAGNOSIS — H0288B Meibomian gland dysfunction left eye, upper and lower eyelids: Secondary | ICD-10-CM | POA: Diagnosis not present

## 2021-02-23 DIAGNOSIS — H1045 Other chronic allergic conjunctivitis: Secondary | ICD-10-CM | POA: Diagnosis not present

## 2021-02-23 DIAGNOSIS — H0288A Meibomian gland dysfunction right eye, upper and lower eyelids: Secondary | ICD-10-CM | POA: Diagnosis not present

## 2021-03-02 ENCOUNTER — Encounter: Payer: Self-pay | Admitting: *Deleted

## 2021-03-08 ENCOUNTER — Ambulatory Visit: Payer: BC Managed Care – PPO | Admitting: *Deleted

## 2021-03-08 ENCOUNTER — Ambulatory Visit: Payer: BC Managed Care – PPO | Attending: Obstetrics and Gynecology

## 2021-03-08 ENCOUNTER — Other Ambulatory Visit: Payer: Self-pay | Admitting: *Deleted

## 2021-03-08 ENCOUNTER — Other Ambulatory Visit: Payer: Self-pay

## 2021-03-08 ENCOUNTER — Encounter: Payer: Self-pay | Admitting: Physician Assistant

## 2021-03-08 ENCOUNTER — Ambulatory Visit (INDEPENDENT_AMBULATORY_CARE_PROVIDER_SITE_OTHER): Payer: BC Managed Care – PPO | Admitting: Physician Assistant

## 2021-03-08 ENCOUNTER — Encounter: Payer: Self-pay | Admitting: *Deleted

## 2021-03-08 ENCOUNTER — Ambulatory Visit: Payer: BC Managed Care – PPO | Admitting: Physician Assistant

## 2021-03-08 VITALS — BP 120/84 | HR 108 | Ht 68.0 in | Wt 177.0 lb

## 2021-03-08 VITALS — BP 135/84 | HR 115 | Ht 68.0 in

## 2021-03-08 DIAGNOSIS — K625 Hemorrhage of anus and rectum: Secondary | ICD-10-CM | POA: Diagnosis not present

## 2021-03-08 DIAGNOSIS — K648 Other hemorrhoids: Secondary | ICD-10-CM

## 2021-03-08 DIAGNOSIS — I10 Essential (primary) hypertension: Secondary | ICD-10-CM | POA: Diagnosis present

## 2021-03-08 DIAGNOSIS — O10919 Unspecified pre-existing hypertension complicating pregnancy, unspecified trimester: Secondary | ICD-10-CM

## 2021-03-08 DIAGNOSIS — O09522 Supervision of elderly multigravida, second trimester: Secondary | ICD-10-CM

## 2021-03-08 DIAGNOSIS — K5900 Constipation, unspecified: Secondary | ICD-10-CM | POA: Diagnosis not present

## 2021-03-08 DIAGNOSIS — Z3A16 16 weeks gestation of pregnancy: Secondary | ICD-10-CM | POA: Insufficient documentation

## 2021-03-08 NOTE — Patient Instructions (Signed)
If you are age 36 or older, your body mass index should be between 23-30. Your Body mass index is 26.91 kg/m. If this is out of the aforementioned range listed, please consider follow up with your Primary Care Provider.  If you are age 83 or younger, your body mass index should be between 19-25. Your Body mass index is 26.91 kg/m. If this is out of the aformentioned range listed, please consider follow up with your Primary Care Provider.   Start Miralax 1 capful in 8 ounces of water or juice daily.  Continue Colace.  Try to drink at least 60 to 80 ounces of water daily.  Continue anal-pram apply 2-3 daily as needed for internal hemorrhoids  Try eating prunes, date and kiwi 1-2 times daily.  Follow up as needed.  Thank you for entrusting me with your care and choosing [facility].  Amy Esterwood, PA-C

## 2021-03-08 NOTE — Progress Notes (Signed)
C/o" spotting x 2 weeks."

## 2021-03-08 NOTE — Progress Notes (Signed)
Subjective:    Patient ID: Alicia Nguyen, female    DOB: 1986-03-18, 35 y.o.   MRN: [id number]  HPI Alicia Nguyen is a pleasant 35 year old white female, new to GI today referred by Dr. Derl Barrow with complaints of rectal bleeding and constipation She has not had prior GI evaluation. Patient is currently [redacted] weeks pregnant with her second child and has had ongoing issues with hyperemesis gravidarum.  She also had prolonged hyperemesis throughout her pregnancy with her first child. She has seen cardiology because her last pregnancy was complicated by preeclampsia. Patient says she has been feeling a little bit better over the past week or so, she is taking Zofran on a scheduled basis and is being more successful keeping down p.o.'s.  She says she has had difficulty keeping down water which she thinks has contributed to her constipation.  Also thinks that Zofran is somewhat constipating.  Over the past month she has been having significant issues with constipation but is able to have a bowel movement about every other day though just passing pellet like stools. She has been taking MiraLAX 17 g in 8 ounces of water about every other day and is trying to take Colace twice daily as well as a dose of Metamucil daily.  She says she drinks about 3 L of water per day that often vomits up at least half of this. A few weeks back she had noticed an increase in rectal bleeding and had seen some clots.  Bleeding has been less over the past couple of weeks though she still see some bright red blood with almost every bowel movement primarily just on the tissue.  She does have rectal pain/discomfort with bowel movements.  She has a prescription for hydrocortisone-pramoxine 2.5% - 1% rectal cream which she has been using once daily No family history of GI disease that she is aware of. Her baby is 36 months old.  Review of Systems Pertinent positive and negative review of systems were noted in the above HPI section.  All  other review of systems was otherwise negative.  Outpatient Encounter Medications as of 03/08/2021  Medication Sig  . aspirin EC 81 MG tablet Take 81 mg by mouth daily. Swallow whole.  . docusate sodium (COLACE) 100 MG capsule Take 200 mg by mouth 2 (two) times daily.  . hydrocortisone-pramoxine (ANALPRAM-HC) 2.5-1 % rectal cream Place 1 application rectally as needed.  . ondansetron (ZOFRAN) 4 MG tablet Take 1 tablet by mouth as needed for nausea/vomiting.  . polyethylene glycol (MIRALAX / GLYCOLAX) 17 g packet Take 17 g by mouth every other day.  . Prenatal Vit-DSS-Fe Cbn-FA (PRENATAL AD PO) Take 1 tablet by mouth daily.  . promethazine (PHENERGAN) 25 MG tablet Take 1 tablet by mouth as needed for nausea/vomiting.  . valACYclovir (VALTREX) 500 MG tablet Take 500 mg by mouth 2 (two) times daily.   No facility-administered encounter medications on file as of 03/08/2021.   No Known Allergies Patient Active Problem List   Diagnosis Date Noted  . Essential hypertension 01/06/2021  . Preeclampsia 01/06/2021  . Pregnancy 01/06/2021   Social History   Socioeconomic History  . Marital status: Married    Spouse name: Not on file  . Number of children: Not on file  . Years of education: Not on file  . Highest education level: Not on file  Occupational History  . Not on file  Tobacco Use  . Smoking status: Never Smoker  . Smokeless tobacco: Never Used  Vaping Use  . Vaping Use: Never used  Substance and Sexual Activity  . Alcohol use: Not Currently  . Drug use: Never  . Sexual activity: Yes  Other Topics Concern  . Not on file  Social History Narrative  . Not on file   Social Determinants of Health   Financial Resource Strain: Not on file  Food Insecurity: Not on file  Transportation Needs: Not on file  Physical Activity: Not on file  Stress: Not on file  Social Connections: Not on file  Intimate Partner Violence: Not on file    Alicia Nguyen's family history includes CVA in  her maternal grandmother; Cervical cancer in her maternal aunt; Colon polyps in her father; Dementia in her maternal aunt; Diabetes in her maternal grandmother; Hypertension in her father; Skin cancer in her maternal grandmother.      Objective:    Vitals:   03/08/21 1138  BP: 120/84  Pulse: (!) 108  SpO2: 99%    Physical Exam Well-developed well-nourished WF  in no acute distress.  Height, YQMVHQ,469 BMI26.9  HEENT; nontraumatic normocephalic, EOMI, PE R LA, sclera anicteric. Oropharynx; Neck; supple, no JVD  Rectal; small noninflamed external hemorrhoid, nontender to digital exam no palpable fissure, on anoscopy she does have internal hemorrhoids. Skin; benign exam, no jaundice rash or appreciable lesions Extremities; no clubbing cyanosis or edema skin warm and dry Neuro/Psych; alert and oriented x4, grossly nonfocal mood and affect appropriate       Assessment & Plan:   #75 35 year old white female, [redacted] weeks pregnant with her second child.  Pregnancy has been complicated by hyperemesis gravidarum. She also has prior history of preeclampsia.  Some improvement in hyperemesis symptoms over the past week or so with scheduled use of Zofran  #2 constipation-pregnancy-induced, and complicated by hyperemesis with inability to keep down adequate amounts of fluids at times and p.o.'s and ongoing use of ondansetron  #3 rectal bleeding, on exam she has noninflamed external hemorrhoid and has internal hemorrhoids which are the likely source of the rectal bleeding, no fissure  Plan; increase MiraLAX to 17 g in 8 ounces of water every day Drink at least 60 to 80 ounces of water daily Continue Colace 1 p.o. twice daily Add 3-4 prunes, dates and/or a kiwi every day for constipation Continue hydrocortisone-pramoxine 2.5% / 1% cream to be applied with anal applicator 2-3 times daily. We had a brief discussion regarding hemorrhoidal banding for internal hemorrhoids which could be considered  in the future if she continues to have problems after delivery of her baby. Patient will be established with Dr. Myrtie Neither and can follow-up in the office with Dr. Myrtie Neither or myself on an as-needed basis.  Rhianon Zabawa Oswald Hillock PA-C 03/08/2021   Cc: Charlett Nose, MD

## 2021-03-10 NOTE — Progress Notes (Signed)
____________________________________________________________  Attending physician addendum:  Thank you for sending this case to me. I have reviewed the entire note and agree with the plan.  I suspect the ondansetron is contributing to the constipation and resultant hemorrhoidal bleeding.  No fissure on your exam. Other anti-emetics (compazine , reglan) generally avoided in pregnancy, so will have to treat through this side effect with miralax once to twice daily.  Hopefully the nausea and vomiting will subside further along in pregnancy. I will be available to see her as needed.  Amada Jupiter, MD  ____________________________________________________________

## 2021-03-12 DIAGNOSIS — Z3490 Encounter for supervision of normal pregnancy, unspecified, unspecified trimester: Secondary | ICD-10-CM | POA: Insufficient documentation

## 2021-03-12 DIAGNOSIS — I10 Essential (primary) hypertension: Secondary | ICD-10-CM | POA: Insufficient documentation

## 2021-03-15 ENCOUNTER — Encounter: Payer: Self-pay | Admitting: Cardiology

## 2021-03-15 ENCOUNTER — Ambulatory Visit (INDEPENDENT_AMBULATORY_CARE_PROVIDER_SITE_OTHER): Payer: BC Managed Care – PPO | Admitting: Cardiology

## 2021-03-15 ENCOUNTER — Other Ambulatory Visit: Payer: Self-pay

## 2021-03-15 VITALS — BP 110/72 | HR 90 | Ht 68.0 in | Wt 173.1 lb

## 2021-03-15 DIAGNOSIS — O09522 Supervision of elderly multigravida, second trimester: Secondary | ICD-10-CM

## 2021-03-15 DIAGNOSIS — Z8759 Personal history of other complications of pregnancy, childbirth and the puerperium: Secondary | ICD-10-CM | POA: Diagnosis not present

## 2021-03-15 HISTORY — DX: Supervision of elderly multigravida, second trimester: O09.522

## 2021-03-15 HISTORY — DX: Personal history of other complications of pregnancy, childbirth and the puerperium: Z87.59

## 2021-03-15 NOTE — Progress Notes (Signed)
Cardio-Obstetrics Clinic  New Evaluation   This is a 35 year old female G2 P1-0-0-1 currently 20 weeks and 1 day pregnant presents for follow-up visit.  Alicia Nguyen does have a history of preeclampsia during her last pregnancy.  Patient follows with Dr. Timothy Lasso.  Alicia Nguyen initially presented on January 06, 2021 to be evaluated and was seen by my partner Dr. Bing Matter.  During that visit Alicia Nguyen reported that during her first pregnancy at 37 weeks was induced due to preeclampsia.  The patient also reports that several years ago Alicia Nguyen had episodes of high blood pressure in took antihypertensives for a while but discontinued.  Now with her second child her blood pressure so far is at target but history of preeclampsia and recent leg swelling prompted her referral to cardiology.   Now being followed in our cardio obstetrics clinic during her pregnancy and in the postpartum period.  Today Alicia Nguyen offers no complaints.  Alicia Nguyen has been exercising Alicia Nguyen walks about a mile.  The leg swelling has improved.  Alicia Nguyen took her blood pressure daily and so far.  No red flags.  No chest pain.   Prior CV Studies Reviewed: The following studies were reviewed today:  Transthoracic echocardiogram given for 04/19/2021 IMPRESSIONS  1. Left ventricular ejection fraction, by estimation, is 60 to 65%. The  left ventricle has normal function. The left ventricle has no regional  wall motion abnormalities. Left ventricular diastolic parameters were  normal.  2. Right ventricular systolic function is normal. The right ventricular  size is normal.  3. The mitral valve is normal in structure. No evidence of mitral valve  regurgitation. No evidence of mitral stenosis.  4. The aortic valve is normal in structure. Aortic valve regurgitation is  not visualized. No aortic stenosis is present.  5. The inferior vena cava is normal in size with greater than 50%  respiratory variability, suggesting right atrial pressure of 3 mmHg.   FINDINGS  Left  Ventricle: Left ventricular ejection fraction, by estimation, is 60  to 65%. The left ventricle has normal function. The left ventricle has no  regional wall motion abnormalities. The left ventricular internal cavity  size was normal in size. There is  no left ventricular hypertrophy. Left ventricular diastolic parameters  were normal.   Right Ventricle: The right ventricular size is normal. No increase in  right ventricular wall thickness. Right ventricular systolic function is  normal.   Left Atrium: Left atrial size was normal in size.   Right Atrium: Right atrial size was normal in size.   Pericardium: There is no evidence of pericardial effusion.   Mitral Valve: The mitral valve is normal in structure. No evidence of  mitral valve regurgitation. No evidence of mitral valve stenosis.   Tricuspid Valve: The tricuspid valve is normal in structure. Tricuspid  valve regurgitation is not demonstrated. No evidence of tricuspid  stenosis.   Aortic Valve: The aortic valve is normal in structure. Aortic valve  regurgitation is not visualized. No aortic stenosis is present.   Pulmonic Valve: The pulmonic valve was normal in structure. Pulmonic valve  regurgitation is not visualized. No evidence of pulmonic stenosis.   Aorta: The aortic root is normal in size and structure.   Venous: The inferior vena cava is normal in size with greater than 50%  respiratory variability, suggesting right atrial pressure of 3 mmHg.   IAS/Shunts: No atrial level shunt detected by color flow Doppler.    Past Medical History:  Diagnosis Date  . Hypertension   .  Preeclampsia   . Pregnant     Past Surgical History:  Procedure Laterality Date  . FOOT FRACTURE SURGERY    . Optic gioma removal    . REFRACTIVE SURGERY        OB History    Gravida  2   Para  1   Term  1   Preterm      AB      Living  1     SAB      IAB      Ectopic      Multiple      Live Births  1                Current Medications: Current Meds  Medication Sig  . aspirin EC 81 MG tablet Take 81 mg by mouth daily. Swallow whole.  . docusate sodium (COLACE) 100 MG capsule Take 200 mg by mouth 2 (two) times daily.  . hydrocortisone-pramoxine (ANALPRAM-HC) 2.5-1 % rectal cream Place 1 application rectally as needed.  . ondansetron (ZOFRAN) 4 MG tablet Take 1 tablet by mouth as needed for nausea/vomiting.  . polyethylene glycol (MIRALAX / GLYCOLAX) 17 g packet Take 17 g by mouth every other day.  . Prenatal Vit-DSS-Fe Cbn-FA (PRENATAL AD PO) Take 1 tablet by mouth daily.  . promethazine (PHENERGAN) 25 MG tablet Take 1 tablet by mouth as needed for nausea/vomiting.  . [DISCONTINUED] valACYclovir (VALTREX) 500 MG tablet Take 500 mg by mouth 2 (two) times daily.     Allergies:   Patient has no known allergies.   Social History   Socioeconomic History  . Marital status: Married    Spouse name: Not on file  . Number of children: Not on file  . Years of education: Not on file  . Highest education level: Not on file  Occupational History  . Not on file  Tobacco Use  . Smoking status: Never Smoker  . Smokeless tobacco: Never Used  Vaping Use  . Vaping Use: Never used  Substance and Sexual Activity  . Alcohol use: Not Currently  . Drug use: Never  . Sexual activity: Yes  Other Topics Concern  . Not on file  Social History Narrative  . Not on file   Social Determinants of Health   Financial Resource Strain: Not on file  Food Insecurity: Unknown  . Worried About Programme researcher, broadcasting/film/video in the Last Year: Never true  . Ran Out of Food in the Last Year: Not on file  Transportation Needs: Unknown  . Lack of Transportation (Medical): No  . Lack of Transportation (Non-Medical): Not on file  Physical Activity: Not on file  Stress: Not on file  Social Connections: Not on file      Family History  Problem Relation Age of Onset  . Colon polyps Father   . Hypertension Father   .  Diabetes Maternal Grandmother   . Skin cancer Maternal Grandmother   . CVA Maternal Grandmother   . Cervical cancer Maternal Aunt   . Dementia Maternal Aunt   . Colon cancer Neg Hx   . Liver disease Neg Hx   . Esophageal cancer Neg Hx   . Pancreatic cancer Neg Hx   . Stomach cancer Neg Hx       ROS:   Please see the history of present illness.     Review of Systems  Constitution: Negative for decreased appetite, fever and weight gain.  HENT: Negative for congestion, ear discharge, hoarse voice  and sore throat.   Eyes: Negative for discharge, redness, vision loss in right eye and visual halos.  Cardiovascular: Negative for chest pain, dyspnea on exertion, leg swelling, orthopnea and palpitations.  Respiratory: Negative for cough, hemoptysis, shortness of breath and snoring.   Endocrine: Negative for heat intolerance and polyphagia.  Hematologic/Lymphatic: Negative for bleeding problem. Does not bruise/bleed easily.  Skin: Negative for flushing, nail changes, rash and suspicious lesions.  Musculoskeletal: Negative for arthritis, joint pain, muscle cramps, myalgias, neck pain and stiffness.  Gastrointestinal: Negative for abdominal pain, bowel incontinence, diarrhea and excessive appetite.  Genitourinary: Negative for decreased libido, genital sores and incomplete emptying.  Neurological: Negative for brief paralysis, focal weakness, headaches and loss of balance.  Psychiatric/Behavioral: Negative for altered mental status, depression and suicidal ideas.  Allergic/Immunologic: Negative for HIV exposure and persistent infections.     Labs/EKG Reviewed:    EKG:   Her EKG were ordered today  Recent Labs: No results found for requested labs within last 8760 hours.   Recent Lipid Panel No results found for: CHOL, TRIG, HDL, CHOLHDL, LDLCALC, LDLDIRECT  Physical Exam:    VS:  BP 110/72   Pulse 90   Ht 5\' 8"  (1.727 m)   Wt 173 lb 1.9 oz (78.5 kg)   LMP 10/25/2020   SpO2 98%    BMI 26.32 kg/m     Wt Readings from Last 3 Encounters:  03/15/21 173 lb 1.9 oz (78.5 kg)  03/08/21 177 lb (80.3 kg)  01/06/21 171 lb (77.6 kg)     GEN: Well nourished, well developed in no acute distress HEENT: Normal NECK: No JVD; No carotid bruits LYMPHATICS: No lymphadenopathy CARDIAC: RRR, no murmurs, rubs, gallops RESPIRATORY:  Clear to auscultation without rales, wheezing or rhonchi  ABDOMEN: Soft, non-tender, non-distended MUSCULOSKELETAL:  No edema; No deformity  SKIN: Warm and dry NEUROLOGIC:  Alert and oriented x 3 PSYCHIATRIC:  Normal affect    Risk Assessment/Risk Calculators:         Modified World Health Organization (WHO) Classification of Maternal CV Risk   WHO Risk Class II (Small increased risk of maternal mortality or moderate increase in morbidity.)         ASSESSMENT & PLAN:    History of preeclampsia Advanced maternal age  We talked about her echo results which was normal.  As well as her left ultrasound which did not show any clots.  Thankfully her blood pressure is at target.  Not asked the patient to take her blood pressure daily.  Alicia Nguyen will remain on her aspirin 81 mg daily.  Alicia Nguyen still has risk for preeclampsia therefore we will continue to monitor her closely for any significant vascular morbidity.  Follow-up in 12 weeks   Dispo:  Return in about 12 weeks (around 06/07/2021).   Medication Adjustments/Labs and Tests Ordered: Current medicines are reviewed at length with the patient today.  Concerns regarding medicines are outlined above.  Tests Ordered: No orders of the defined types were placed in this encounter.  Medication Changes: No orders of the defined types were placed in this encounter.

## 2021-03-15 NOTE — Patient Instructions (Signed)
Medication Instructions:  Your physician recommends that you continue on your current medications as directed. Please refer to the Current Medication list given to you today.  *If you need a refill on your cardiac medications before your next appointment, please call your pharmacy*   Lab Work: None If you have labs (blood work) drawn today and your tests are completely normal, you will receive your results only by: Marland Kitchen MyChart Message (if you have MyChart) OR . A paper copy in the mail If you have any lab test that is abnormal or we need to change your treatment, we will call you to review the results.   Testing/Procedures: None   Follow-Up: At [facility], you and your health needs are our priority.  As part of our continuing mission to provide you with exceptional heart care, we have created designated Provider Care Teams.  These Care Teams include your primary Cardiologist (physician) and Advanced Practice Providers (APPs -  Physician Assistants and Nurse Practitioners) who all work together to provide you with the care you need, when you need it.  We recommend signing up for the patient portal called "MyChart".  Sign up information is provided on this After Visit Summary.  MyChart is used to connect with patients for Virtual Visits (Telemedicine).  Patients are able to view lab/test results, encounter notes, upcoming appointments, etc.  Non-urgent messages can be sent to your provider as well.   To learn more about what you can do with MyChart, go to [email or web address].    Your next appointment:   12 week(s)  The format for your next appointment:   In Person  Provider:   [facility]   Other Instructions

## 2021-03-17 DIAGNOSIS — M545 Low back pain, unspecified: Secondary | ICD-10-CM | POA: Diagnosis not present

## 2021-03-17 DIAGNOSIS — Z369 Encounter for antenatal screening, unspecified: Secondary | ICD-10-CM | POA: Diagnosis not present

## 2021-04-05 ENCOUNTER — Ambulatory Visit: Payer: BC Managed Care – PPO

## 2021-04-14 ENCOUNTER — Ambulatory Visit (HOSPITAL_BASED_OUTPATIENT_CLINIC_OR_DEPARTMENT_OTHER): Payer: BC Managed Care – PPO

## 2021-04-14 ENCOUNTER — Other Ambulatory Visit: Payer: Self-pay

## 2021-04-14 ENCOUNTER — Encounter: Payer: Self-pay | Admitting: *Deleted

## 2021-04-14 ENCOUNTER — Ambulatory Visit: Payer: BC Managed Care – PPO | Attending: Obstetrics | Admitting: *Deleted

## 2021-04-14 VITALS — BP 142/95 | HR 87

## 2021-04-14 DIAGNOSIS — O10012 Pre-existing essential hypertension complicating pregnancy, second trimester: Secondary | ICD-10-CM | POA: Insufficient documentation

## 2021-04-14 DIAGNOSIS — Z3A24 24 weeks gestation of pregnancy: Secondary | ICD-10-CM | POA: Insufficient documentation

## 2021-04-14 DIAGNOSIS — O09512 Supervision of elderly primigravida, second trimester: Secondary | ICD-10-CM | POA: Diagnosis not present

## 2021-04-14 DIAGNOSIS — O321XX0 Maternal care for breech presentation, not applicable or unspecified: Secondary | ICD-10-CM

## 2021-04-14 DIAGNOSIS — O09292 Supervision of pregnancy with other poor reproductive or obstetric history, second trimester: Secondary | ICD-10-CM | POA: Diagnosis not present

## 2021-04-14 DIAGNOSIS — Z362 Encounter for other antenatal screening follow-up: Secondary | ICD-10-CM

## 2021-04-14 DIAGNOSIS — O10919 Unspecified pre-existing hypertension complicating pregnancy, unspecified trimester: Secondary | ICD-10-CM

## 2021-04-14 DIAGNOSIS — O358XX0 Maternal care for other (suspected) fetal abnormality and damage, not applicable or unspecified: Secondary | ICD-10-CM | POA: Diagnosis not present

## 2021-04-14 DIAGNOSIS — Z8759 Personal history of other complications of pregnancy, childbirth and the puerperium: Secondary | ICD-10-CM

## 2021-04-14 DIAGNOSIS — O4592 Premature separation of placenta, unspecified, second trimester: Secondary | ICD-10-CM | POA: Diagnosis not present

## 2021-04-14 DIAGNOSIS — O10912 Unspecified pre-existing hypertension complicating pregnancy, second trimester: Secondary | ICD-10-CM

## 2021-04-14 DIAGNOSIS — O283 Abnormal ultrasonic finding on antenatal screening of mother: Secondary | ICD-10-CM | POA: Diagnosis not present

## 2021-04-15 ENCOUNTER — Other Ambulatory Visit: Payer: Self-pay | Admitting: *Deleted

## 2021-04-15 DIAGNOSIS — O10912 Unspecified pre-existing hypertension complicating pregnancy, second trimester: Secondary | ICD-10-CM

## 2021-04-15 DIAGNOSIS — Z369 Encounter for antenatal screening, unspecified: Secondary | ICD-10-CM | POA: Diagnosis not present

## 2021-05-04 DIAGNOSIS — M5489 Other dorsalgia: Secondary | ICD-10-CM | POA: Diagnosis not present

## 2021-05-12 ENCOUNTER — Ambulatory Visit: Payer: BC Managed Care – PPO | Admitting: *Deleted

## 2021-05-12 ENCOUNTER — Other Ambulatory Visit: Payer: Self-pay | Admitting: *Deleted

## 2021-05-12 ENCOUNTER — Encounter: Payer: Self-pay | Admitting: *Deleted

## 2021-05-12 ENCOUNTER — Other Ambulatory Visit: Payer: Self-pay

## 2021-05-12 ENCOUNTER — Ambulatory Visit: Payer: BC Managed Care – PPO | Attending: Obstetrics and Gynecology

## 2021-05-12 VITALS — BP 143/91 | HR 97

## 2021-05-12 DIAGNOSIS — O10912 Unspecified pre-existing hypertension complicating pregnancy, second trimester: Secondary | ICD-10-CM | POA: Insufficient documentation

## 2021-05-12 DIAGNOSIS — O4693 Antepartum hemorrhage, unspecified, third trimester: Secondary | ICD-10-CM

## 2021-05-12 DIAGNOSIS — O09513 Supervision of elderly primigravida, third trimester: Secondary | ICD-10-CM

## 2021-05-12 DIAGNOSIS — O09523 Supervision of elderly multigravida, third trimester: Secondary | ICD-10-CM | POA: Insufficient documentation

## 2021-05-12 DIAGNOSIS — Z362 Encounter for other antenatal screening follow-up: Secondary | ICD-10-CM | POA: Diagnosis not present

## 2021-05-12 DIAGNOSIS — O10013 Pre-existing essential hypertension complicating pregnancy, third trimester: Secondary | ICD-10-CM

## 2021-05-12 DIAGNOSIS — Z3A28 28 weeks gestation of pregnancy: Secondary | ICD-10-CM

## 2021-05-12 DIAGNOSIS — O10913 Unspecified pre-existing hypertension complicating pregnancy, third trimester: Secondary | ICD-10-CM

## 2021-05-13 DIAGNOSIS — Z23 Encounter for immunization: Secondary | ICD-10-CM | POA: Diagnosis not present

## 2021-05-13 DIAGNOSIS — Z3480 Encounter for supervision of other normal pregnancy, unspecified trimester: Secondary | ICD-10-CM | POA: Diagnosis not present

## 2021-05-20 DIAGNOSIS — R609 Edema, unspecified: Secondary | ICD-10-CM | POA: Diagnosis not present

## 2021-05-27 DIAGNOSIS — Z369 Encounter for antenatal screening, unspecified: Secondary | ICD-10-CM | POA: Diagnosis not present

## 2021-06-04 ENCOUNTER — Ambulatory Visit (INDEPENDENT_AMBULATORY_CARE_PROVIDER_SITE_OTHER): Payer: BC Managed Care – PPO | Admitting: Cardiology

## 2021-06-04 ENCOUNTER — Other Ambulatory Visit: Payer: Self-pay

## 2021-06-04 ENCOUNTER — Encounter: Payer: Self-pay | Admitting: Cardiology

## 2021-06-04 VITALS — BP 140/88 | HR 110 | Ht 68.0 in | Wt 191.3 lb

## 2021-06-04 DIAGNOSIS — Z8759 Personal history of other complications of pregnancy, childbirth and the puerperium: Secondary | ICD-10-CM | POA: Diagnosis not present

## 2021-06-04 DIAGNOSIS — O09523 Supervision of elderly multigravida, third trimester: Secondary | ICD-10-CM

## 2021-06-04 DIAGNOSIS — O133 Gestational [pregnancy-induced] hypertension without significant proteinuria, third trimester: Secondary | ICD-10-CM | POA: Diagnosis not present

## 2021-06-04 HISTORY — DX: Supervision of elderly multigravida, third trimester: O09.523

## 2021-06-04 HISTORY — DX: Gestational (pregnancy-induced) hypertension without significant proteinuria, third trimester: O13.3

## 2021-06-04 MED ORDER — NIFEDIPINE ER OSMOTIC RELEASE 30 MG PO TB24: 30.0000 mg | ORAL_TABLET | Freq: Every day | ORAL | 3 refills | Status: DC

## 2021-06-04 NOTE — Progress Notes (Signed)
Cardio-Obstetrics Clinic  Follow Up Note  Date:  06/04/2021   ID:  Alicia Nguyen, DOB [birth year], MRN [id number]  PCP:  Pcp, No   [facility] Providers Cardiologist:  None  Electrophysiologist:  None        Referring MD: No ref. provider found   Chief Complaint: Blood pressure concerns, history of pre-eclampsia  History of Present Illness:    Alicia Nguyen is a 35 y.o. female [G2P1001], currently at 31 weeks and 5 days, who returns for follow up on her blood pressure. Patient with a history of pre-eclampsia during her last pregnancy (delivered in August 2021). Also with a history of HTN, for which she took antihypertensives from 2014-2018, but this reportedly resolved after she lost a significant amount of weight.   She is on aspirin 81mg  daily and earlier this pregnancy her blood pressure had been normal. At her initial cardio-ob visit on 03/15/21 her BP was 110/72.  Patient checks her blood pressure at home and over the past few weeks it has been consistently elevated to 130s/80s, particularly in the afternoon. She has associated headaches which occur approximately every other day. Also reports a history of visual floaters which have worsened recently. States she can tell her BP is high based on how she has been feeling. Occasional swelling in her fingers at night but no other swelling.    Prior CV Studies Reviewed: The following studies were reviewed today: Echo 02/03/2021: EF 60-65%, normal LV and RV function, normal diastolic parameters, normal valves   Past Medical History:  Diagnosis Date   Hypertension    Preeclampsia    Pregnant     Past Surgical History:  Procedure Laterality Date   FOOT FRACTURE SURGERY     Optic gioma removal     REFRACTIVE SURGERY        OB History     Gravida  2   Para  1   Term  1   Preterm      AB      Living  1      SAB      IAB      Ectopic      Multiple      Live Births  1               Current  Medications: Current Meds  Medication Sig   aspirin EC 81 MG tablet Take 81 mg by mouth daily. Swallow whole.   docusate sodium (COLACE) 100 MG capsule Take 200 mg by mouth 2 (two) times daily.   hydrocortisone-pramoxine (ANALPRAM-HC) 2.5-1 % rectal cream Place 1 application rectally as needed for hemorrhoids.   NIFEdipine (PROCARDIA XL) 30 MG 24 hr tablet Take 1 tablet (30 mg total) by mouth daily.   omeprazole (PRILOSEC) 20 MG capsule Take 20 mg by mouth daily.   ondansetron (ZOFRAN) 4 MG tablet Take 1 tablet by mouth as needed for nausea/vomiting.   polyethylene glycol (MIRALAX / GLYCOLAX) 17 g packet Take 17 g by mouth every other day.   Prenatal Vit-DSS-Fe Cbn-FA (PRENATAL AD PO) Take 1 tablet by mouth daily. Unknown strenght   promethazine (PHENERGAN) 25 MG tablet Take 1 tablet by mouth as needed for nausea/vomiting.     Allergies:   Patient has no known allergies.   Social History   Socioeconomic History   Marital status: Married    Spouse name: Not on file   Number of children: Not on file   Years of education: Not on file  Highest education level: Not on file  Occupational History   Not on file  Tobacco Use   Smoking status: Never   Smokeless tobacco: Never  Vaping Use   Vaping Use: Never used  Substance and Sexual Activity   Alcohol use: Not Currently   Drug use: Never   Sexual activity: Yes  Other Topics Concern   Not on file  Social History Narrative   Not on file   Social Determinants of Health   Financial Resource Strain: Not on file  Food Insecurity: Unknown   Worried About Running Out of Food in the Last Year: Never true   Ran Out of Food in the Last Year: Not on file  Transportation Needs: Unknown   Lack of Transportation (Medical): No   Lack of Transportation (Non-Medical): Not on file  Physical Activity: Not on file  Stress: Not on file  Social Connections: Not on file      Family History  Problem Relation Age of Onset   Colon polyps Father     Hypertension Father    Diabetes Maternal Grandmother    Skin cancer Maternal Grandmother    CVA Maternal Grandmother    Cervical cancer Maternal Aunt    Dementia Maternal Aunt    Colon cancer Neg Hx    Liver disease Neg Hx    Esophageal cancer Neg Hx    Pancreatic cancer Neg Hx    Stomach cancer Neg Hx       ROS:   Please see the history of present illness.    All other systems reviewed and are negative.   Labs/EKG Reviewed:    EKG:   EKG was not ordered today.  Prior EKG from 01/06/21 shows normal sinus rhythm at 86 bpm, normal axis, normal intervals, no ST/T changes.  Recent Labs: No results found for requested labs within last 8760 hours.   Recent Lipid Panel No results found for: CHOL, TRIG, HDL, CHOLHDL, LDLCALC, LDLDIRECT  Physical Exam:    VS:  BP 140/88 (BP Location: Right Arm, Patient Position: Sitting)   Pulse (!) 110   Ht 5\' 8"  (1.727 m)   Wt 191 lb 4.8 oz (86.8 kg)   LMP 10/25/2020   SpO2 100%   BMI 29.09 kg/m     Wt Readings from Last 3 Encounters:  06/04/21 191 lb 4.8 oz (86.8 kg)  03/15/21 173 lb 1.9 oz (78.5 kg)  03/08/21 177 lb (80.3 kg)     GEN: Well nourished, well developed in no acute distress HEENT: Normal NECK: No JVD; No carotid bruits LYMPHATICS: No lymphadenopathy CARDIAC: RRR, III/VI systolic flow murmur RESPIRATORY:  Clear to auscultation without rales, wheezing or rhonchi  MUSCULOSKELETAL:  No edema; No deformity  SKIN: Warm and dry NEUROLOGIC:  Alert and oriented x 3 PSYCHIATRIC:  Normal affect    Risk Assessment/Risk Calculators:     CARPREG II Risk Prediction Index Score:  1.  The patient's risk for a primary cardiac event is 5%.            ASSESSMENT & PLAN:    Gestational hypertension Hx of preeclampsia Advanced maternal age  Patient with elevated blood pressure at today's visit. Initial reading 140/88 in left arm, repeat 148/104 on right. Patient also with reliable home BP readings above 130/80. Given  multiple elevated readings and her history of pre-eclampsia, will start Procardia 30mg  daily. Patient reports she had recent blood work at her OB, so we will not repeat today. Will request these records (followed  at [facility]). Patient to continue monitoring BP at home and contact us in 1 week with the readings. Advised to call or send mychart message with any concerns. Continue ASA 81mg  daily. Follow up in 4 weeks.   Patient Instructions  Medication Instructions:  Your physician has recommended you make the following change in your medication:  START: Procardia 30 mg once daily  Please take your blood pressure daily for 1 week and send readings in a MyChart message.  *If you need a refill on your cardiac medications before your next appointment, please call your pharmacy*   Lab Work: None If you have labs (blood work) drawn today and your tests are completely normal, you will receive your results only by: MyChart Message (if you have MyChart) OR A paper copy in the mail If you have any lab test that is abnormal or we need to change your treatment, we will call you to review the results.   Testing/Procedures: None   Follow-Up: At [facility], you and your health needs are our priority.  As part of our continuing mission to provide you with exceptional heart care, we have created designated Provider Care Teams.  These Care Teams include your primary Cardiologist (physician) and Advanced Practice Providers (APPs -  Physician Assistants and Nurse Practitioners) who all work together to provide you with the care you need, when you need it.  We recommend signing up for the patient portal called "MyChart".  Sign up information is provided on this After Visit Summary.  MyChart is used to connect with patients for Virtual Visits (Telemedicine).  Patients are able to view lab/test results, encounter notes, upcoming appointments, etc.  Non-urgent messages can be sent to your provider as  well.   To learn more about what you can do with MyChart, go to [facility].    Your next appointment:   4 -6 week(s)  The format for your next appointment:   In Person  Provider:   [email or web address] - [facility] [address] [place], [place]    Other Instructions   Dispo:  No follow-ups on file.   Medication Adjustments/Labs and Tests Ordered: Current medicines are reviewed at length with the patient today.  Concerns regarding medicines are outlined above.  Tests Ordered: No orders of the defined types were placed in this encounter.  Medication Changes: Meds ordered this encounter  Medications   NIFEdipine (PROCARDIA XL) 30 MG 24 hr tablet    Sig: Take 1 tablet (30 mg total) by mouth daily.    Dispense:  60 tablet    Refill:  3     [id number], MD PGY-2 [facility]

## 2021-06-04 NOTE — Patient Instructions (Addendum)
Medication Instructions:  Your physician has recommended you make the following change in your medication:  START: Procardia 30 mg once daily  Please take your blood pressure daily for 1 week and send readings in a MyChart message.  *If you need a refill on your cardiac medications before your next appointment, please call your pharmacy*   Lab Work: None If you have labs (blood work) drawn today and your tests are completely normal, you will receive your results only by: MyChart Message (if you have MyChart) OR A paper copy in the mail If you have any lab test that is abnormal or we need to change your treatment, we will call you to review the results.   Testing/Procedures: None   Follow-Up: At [facility], you and your health needs are our priority.  As part of our continuing mission to provide you with exceptional heart care, we have created designated Provider Care Teams.  These Care Teams include your primary Cardiologist (physician) and Advanced Practice Providers (APPs -  Physician Assistants and Nurse Practitioners) who all work together to provide you with the care you need, when you need it.  We recommend signing up for the patient portal called "MyChart".  Sign up information is provided on this After Visit Summary.  MyChart is used to connect with patients for Virtual Visits (Telemedicine).  Patients are able to view lab/test results, encounter notes, upcoming appointments, etc.  Non-urgent messages can be sent to your provider as well.   To learn more about what you can do with MyChart, go to [email or web address].    Your next appointment:   4 -6 week(s)  The format for your next appointment:   In Person  Provider:   Thomasene Ripple - [facility] at [facility] [address] [place], [place] [address]    Other Instructions

## 2021-06-07 ENCOUNTER — Other Ambulatory Visit: Payer: Self-pay | Admitting: *Deleted

## 2021-06-07 DIAGNOSIS — O10919 Unspecified pre-existing hypertension complicating pregnancy, unspecified trimester: Secondary | ICD-10-CM

## 2021-06-09 ENCOUNTER — Ambulatory Visit: Payer: BC Managed Care – PPO

## 2021-06-14 ENCOUNTER — Ambulatory Visit (HOSPITAL_BASED_OUTPATIENT_CLINIC_OR_DEPARTMENT_OTHER): Payer: BC Managed Care – PPO | Admitting: Obstetrics

## 2021-06-14 ENCOUNTER — Other Ambulatory Visit: Payer: Self-pay

## 2021-06-14 ENCOUNTER — Ambulatory Visit (HOSPITAL_BASED_OUTPATIENT_CLINIC_OR_DEPARTMENT_OTHER): Payer: BC Managed Care – PPO | Admitting: *Deleted

## 2021-06-14 ENCOUNTER — Ambulatory Visit: Payer: BC Managed Care – PPO | Attending: Obstetrics and Gynecology

## 2021-06-14 ENCOUNTER — Ambulatory Visit: Payer: BC Managed Care – PPO | Admitting: *Deleted

## 2021-06-14 VITALS — BP 153/107 | HR 115

## 2021-06-14 VITALS — BP 149/99

## 2021-06-14 DIAGNOSIS — O4593 Premature separation of placenta, unspecified, third trimester: Secondary | ICD-10-CM

## 2021-06-14 DIAGNOSIS — O09523 Supervision of elderly multigravida, third trimester: Secondary | ICD-10-CM | POA: Diagnosis not present

## 2021-06-14 DIAGNOSIS — O283 Abnormal ultrasonic finding on antenatal screening of mother: Secondary | ICD-10-CM | POA: Diagnosis present

## 2021-06-14 DIAGNOSIS — O10913 Unspecified pre-existing hypertension complicating pregnancy, third trimester: Secondary | ICD-10-CM | POA: Insufficient documentation

## 2021-06-14 DIAGNOSIS — O09513 Supervision of elderly primigravida, third trimester: Secondary | ICD-10-CM

## 2021-06-14 DIAGNOSIS — Z3A33 33 weeks gestation of pregnancy: Secondary | ICD-10-CM

## 2021-06-14 DIAGNOSIS — O10013 Pre-existing essential hypertension complicating pregnancy, third trimester: Secondary | ICD-10-CM | POA: Diagnosis not present

## 2021-06-14 DIAGNOSIS — O10919 Unspecified pre-existing hypertension complicating pregnancy, unspecified trimester: Secondary | ICD-10-CM | POA: Insufficient documentation

## 2021-06-14 NOTE — Procedures (Signed)
Alicia Nguyen 08-18-1986 [redacted]w[redacted]d  Fetus A Non-Stress Test Interpretation for 06/14/21  Indication: Unsatisfactory BPP  Fetal Heart Rate A Mode: External Baseline Rate (A): 140 bpm Variability: Moderate Accelerations: 15 x 15 Decelerations: None Multiple birth?: No  Uterine Activity Mode: Palpation, Toco Contraction Frequency (min): none Resting Tone Palpated: Relaxed  Interpretation (Fetal Testing) Nonstress Test Interpretation: Reactive Overall Impression: Reassuring for gestational age Comments: Dr. Parke Poisson reviewed tracing

## 2021-06-14 NOTE — Progress Notes (Signed)
Dr. Fang aware of elevated BP's. 

## 2021-06-14 NOTE — Progress Notes (Signed)
MFM Note  Alicia Nguyen was seen for a follow up growth scan due to chronic hypertension.  She was recently started by cardiology on Procardia 20 mg daily for blood pressure control.  The patient's blood pressures in our office were 140/100, 129/91, and 149/99.  She denies any signs or symptoms of preeclampsia.  She was informed that the fetal growth and amniotic fluid level appears appropriate for her gestational age.  The patient had a reactive NST today.   Due to her elevated blood pressures, the patient's nifedipine dose may have to be increased.  I would recommend that she be started on Procardia XL 60 mg daily.  She will call your office tomorrow to have a new prescription sent in.  The patient was advised to continue to monitor her blood pressures at home 2 times a day.    Preeclampsia precautions were reviewed.  She was advised to go to the hospital should she experience any signs or symptoms of severe preeclampsia or if her blood pressures are persistently greater than 150 over high 90s despite treatment.  Should her blood pressures continue to be elevated despite treatment, delivery may be considered at around 37 weeks.  Due to chronic hypertension, we will continue to follow her with weekly fetal testing.    A biophysical profile was scheduled in 1 week.  A total of 15 minutes was spent counseling and coordinating the care for this patient.  Greater than 50% of the time was spent in direct face-to-face contact.  Recommendations:  Procardia XL 60 mg daily Continue to monitor blood pressures at home Weekly fetal testing Consider delivery at 37 weeks should her blood pressures continue to be elevated

## 2021-06-15 ENCOUNTER — Other Ambulatory Visit: Payer: Self-pay | Admitting: *Deleted

## 2021-06-15 DIAGNOSIS — O10913 Unspecified pre-existing hypertension complicating pregnancy, third trimester: Secondary | ICD-10-CM

## 2021-06-16 DIAGNOSIS — Z369 Encounter for antenatal screening, unspecified: Secondary | ICD-10-CM | POA: Diagnosis not present

## 2021-06-16 DIAGNOSIS — O169 Unspecified maternal hypertension, unspecified trimester: Secondary | ICD-10-CM | POA: Diagnosis not present

## 2021-06-18 ENCOUNTER — Ambulatory Visit: Payer: BC Managed Care – PPO | Admitting: Cardiology

## 2021-06-18 DIAGNOSIS — I10 Essential (primary) hypertension: Secondary | ICD-10-CM | POA: Diagnosis not present

## 2021-06-23 ENCOUNTER — Ambulatory Visit: Payer: BC Managed Care – PPO | Attending: Obstetrics

## 2021-06-23 ENCOUNTER — Ambulatory Visit: Payer: BC Managed Care – PPO | Admitting: *Deleted

## 2021-06-23 ENCOUNTER — Other Ambulatory Visit: Payer: Self-pay

## 2021-06-23 VITALS — BP 130/92 | HR 115

## 2021-06-23 DIAGNOSIS — O4593 Premature separation of placenta, unspecified, third trimester: Secondary | ICD-10-CM | POA: Diagnosis not present

## 2021-06-23 DIAGNOSIS — O09513 Supervision of elderly primigravida, third trimester: Secondary | ICD-10-CM | POA: Diagnosis not present

## 2021-06-23 DIAGNOSIS — O10913 Unspecified pre-existing hypertension complicating pregnancy, third trimester: Secondary | ICD-10-CM | POA: Diagnosis not present

## 2021-06-23 DIAGNOSIS — O10013 Pre-existing essential hypertension complicating pregnancy, third trimester: Secondary | ICD-10-CM

## 2021-06-23 DIAGNOSIS — Z3A34 34 weeks gestation of pregnancy: Secondary | ICD-10-CM | POA: Diagnosis not present

## 2021-06-23 DIAGNOSIS — Z369 Encounter for antenatal screening, unspecified: Secondary | ICD-10-CM | POA: Diagnosis not present

## 2021-06-30 ENCOUNTER — Encounter (HOSPITAL_COMMUNITY): Payer: Self-pay | Admitting: Obstetrics and Gynecology

## 2021-06-30 ENCOUNTER — Inpatient Hospital Stay (HOSPITAL_COMMUNITY)
Admission: AD | Admit: 2021-06-30 | Discharge: 2021-06-30 | Disposition: A | Discharge status: Home / Self Care | Payer: BC Managed Care – PPO | Attending: Obstetrics and Gynecology | Admitting: Obstetrics and Gynecology

## 2021-06-30 ENCOUNTER — Ambulatory Visit (HOSPITAL_BASED_OUTPATIENT_CLINIC_OR_DEPARTMENT_OTHER): Payer: BC Managed Care – PPO

## 2021-06-30 ENCOUNTER — Ambulatory Visit: Payer: BC Managed Care – PPO | Admitting: *Deleted

## 2021-06-30 ENCOUNTER — Other Ambulatory Visit: Payer: Self-pay

## 2021-06-30 ENCOUNTER — Other Ambulatory Visit: Payer: Self-pay | Admitting: Obstetrics

## 2021-06-30 VITALS — BP 133/87 | HR 108

## 2021-06-30 DIAGNOSIS — Z7982 Long term (current) use of aspirin: Secondary | ICD-10-CM | POA: Diagnosis not present

## 2021-06-30 DIAGNOSIS — O133 Gestational [pregnancy-induced] hypertension without significant proteinuria, third trimester: Secondary | ICD-10-CM | POA: Diagnosis not present

## 2021-06-30 DIAGNOSIS — O283 Abnormal ultrasonic finding on antenatal screening of mother: Secondary | ICD-10-CM

## 2021-06-30 DIAGNOSIS — Z79899 Other long term (current) drug therapy: Secondary | ICD-10-CM | POA: Diagnosis not present

## 2021-06-30 DIAGNOSIS — O26893 Other specified pregnancy related conditions, third trimester: Secondary | ICD-10-CM | POA: Diagnosis present

## 2021-06-30 DIAGNOSIS — O09523 Supervision of elderly multigravida, third trimester: Secondary | ICD-10-CM | POA: Diagnosis not present

## 2021-06-30 DIAGNOSIS — O10913 Unspecified pre-existing hypertension complicating pregnancy, third trimester: Secondary | ICD-10-CM | POA: Insufficient documentation

## 2021-06-30 DIAGNOSIS — O10013 Pre-existing essential hypertension complicating pregnancy, third trimester: Secondary | ICD-10-CM | POA: Diagnosis not present

## 2021-06-30 DIAGNOSIS — O09293 Supervision of pregnancy with other poor reproductive or obstetric history, third trimester: Secondary | ICD-10-CM

## 2021-06-30 DIAGNOSIS — O4593 Premature separation of placenta, unspecified, third trimester: Secondary | ICD-10-CM | POA: Diagnosis not present

## 2021-06-30 DIAGNOSIS — Z3A35 35 weeks gestation of pregnancy: Secondary | ICD-10-CM | POA: Diagnosis not present

## 2021-06-30 DIAGNOSIS — H539 Unspecified visual disturbance: Secondary | ICD-10-CM | POA: Insufficient documentation

## 2021-06-30 DIAGNOSIS — O163 Unspecified maternal hypertension, third trimester: Secondary | ICD-10-CM | POA: Insufficient documentation

## 2021-06-30 DIAGNOSIS — R519 Headache, unspecified: Secondary | ICD-10-CM | POA: Diagnosis not present

## 2021-06-30 DIAGNOSIS — R42 Dizziness and giddiness: Secondary | ICD-10-CM | POA: Diagnosis not present

## 2021-06-30 DIAGNOSIS — Z369 Encounter for antenatal screening, unspecified: Secondary | ICD-10-CM | POA: Diagnosis not present

## 2021-06-30 LAB — COMPREHENSIVE METABOLIC PANEL
ALT: 14 U/L (ref 0–44)
AST: 14 U/L — ABNORMAL LOW (ref 15–41)
Albumin: 2.8 g/dL — ABNORMAL LOW (ref 3.5–5.0)
Alkaline Phosphatase: 108 U/L (ref 38–126)
Anion gap: 11 (ref 5–15)
BUN: 8 mg/dL (ref 6–20)
CO2: 20 mmol/L — ABNORMAL LOW (ref 22–32)
Calcium: 9.1 mg/dL (ref 8.9–10.3)
Chloride: 107 mmol/L (ref 98–111)
Creatinine, Ser: 0.64 mg/dL (ref 0.44–1.00)
GFR, Estimated: >60 mL/min (ref >60)
Glucose, Bld: 77 mg/dL (ref 70–99)
Potassium: 3.9 mmol/L (ref 3.5–5.1)
Sodium: 138 mmol/L (ref 135–145)
Total Bilirubin: 0.1 mg/dL — ABNORMAL LOW (ref 0.3–1.2)
Total Protein: 6.8 g/dL (ref 6.5–8.1)

## 2021-06-30 LAB — URINALYSIS, ROUTINE W REFLEX MICROSCOPIC
Bilirubin Urine: NEGATIVE
Glucose, UA: NEGATIVE mg/dL
Hgb urine dipstick: NEGATIVE
Ketones, ur: NEGATIVE mg/dL
Nitrite: NEGATIVE
Protein, ur: NEGATIVE mg/dL
Specific Gravity, Urine: 1.016 (ref 1.005–1.030)
pH: 5 (ref 5.0–8.0)

## 2021-06-30 LAB — CBC
HCT: 34.2 % — ABNORMAL LOW (ref 36.0–46.0)
Hemoglobin: 11.6 g/dL — ABNORMAL LOW (ref 12.0–15.0)
MCH: 30.3 pg (ref 26.0–34.0)
MCHC: 33.9 g/dL (ref 30.0–36.0)
MCV: 89.3 fL (ref 80.0–100.0)
Platelets: 324 10*3/uL (ref 150–400)
RBC: 3.83 MIL/uL — ABNORMAL LOW (ref 3.87–5.11)
RDW: 13 % (ref 11.5–15.5)
WBC: 11.1 10*3/uL — ABNORMAL HIGH (ref 4.0–10.5)
nRBC: 0 % (ref 0.0–0.2)

## 2021-06-30 LAB — PROTEIN / CREATININE RATIO, URINE
Creatinine, Urine: 97.26 mg/dL
Protein Creatinine Ratio: 0.12 mg/mg{Cre} (ref 0.00–0.15)
Total Protein, Urine: 12 mg/dL

## 2021-06-30 MED ORDER — BUTALBITAL-APAP-CAFFEINE 50-325-40 MG PO TABS
2.0000 | ORAL_TABLET | Freq: Once | ORAL | Status: AC
  Administered 2021-06-30: 2 via ORAL
  Filled 2021-06-30: qty 2

## 2021-06-30 MED ORDER — ONDANSETRON 4 MG PO TBDP
4.0000 mg | ORAL_TABLET | Freq: Once | ORAL | Status: AC
  Administered 2021-06-30: 4 mg via ORAL
  Filled 2021-06-30: qty 1

## 2021-06-30 NOTE — MAU Provider Note (Signed)
Patient Alicia Nguyen is a G2P1001  At  109w3d here complaints of passing out last night and today. She did not hit her head. She says that this happened during vomiting or climbing stairs. It used to happen with her first pregnancy but it has not happened with her second pregnancy until yesterday. She reports that she is vomiting 6/day. She denies VB, LOF. She reports that baby is moving frequently but the kicks are a little less strong. She denies any contractions.   She reports that she has a dull headache that feels "throbbing" behind her eyes and feels different from her other headaches.   Patient reports that she had high blood pressure from 2014 to 2018 and then it "went away". She then developed pre-e with her first child (had "great bp until 37 weeks and then had to induced"). She says she noticed a spike in her BP around 32 weeks. She was started on procardia 30 mg XL at 32 weeks and then around 34 weeks it was increased to 60 mg XL. She takes her medicine in the AM, and she took it this morning. She reports that her BP in the office most recently was "150/100s" and "all over the place at home. 130/90s to 140s/90s".  She was seen at MFM today for BPP for [facility]. She had an 8/10 (-2 for breathing); however she had a reactive NST. She then had an appointment at [facility] at 3 pm. At that visit, her BP was 130/90s. Due to her complaint of passing out and headache, she was sent to MAU for evaluation.    History     CSN: [id number]  Arrival date and time: 06/30/21 1550   Event Date/Time   First Provider Initiated Contact with Patient 06/30/21 1635      Chief Complaint  Patient presents with   BP Evaluation   Headache  This is a recurrent problem. The current episode started yesterday. The problem occurs constantly. The pain is located in the Retro-orbital region. The pain does not radiate. The pain quality is not similar to prior headaches. The quality of the pain is described as pulsating.  The pain is at a severity of 4/10. Associated symptoms include dizziness and a visual change. Pertinent negatives include no fever, phonophobia or photophobia. Nothing aggravates the symptoms. She has tried nothing for the symptoms.   OB History     Gravida  2   Para  1   Term  1   Preterm      AB      Living  1      SAB      IAB      Ectopic      Multiple      Live Births  1           Past Medical History:  Diagnosis Date   Hypertension    Preeclampsia    Pregnant     Past Surgical History:  Procedure Laterality Date   FOOT FRACTURE SURGERY     Optic gioma removal     REFRACTIVE SURGERY      Family History  Problem Relation Age of Onset   Colon polyps Father    Hypertension Father    Diabetes Maternal Grandmother    Skin cancer Maternal Grandmother    CVA Maternal Grandmother    Cervical cancer Maternal Aunt    Dementia Maternal Aunt    Colon cancer Neg Hx    Liver disease Neg Hx  Esophageal cancer Neg Hx    Pancreatic cancer Neg Hx    Stomach cancer Neg Hx     Social History   Tobacco Use   Smoking status: Never   Smokeless tobacco: Never  Vaping Use   Vaping Use: Never used  Substance Use Topics   Alcohol use: Not Currently   Drug use: Never    Allergies: No Known Allergies  Medications Prior to Admission  Medication Sig Dispense Refill Last Dose   aspirin EC 81 MG tablet Take 81 mg by mouth daily. Swallow whole.   06/30/2021   docusate sodium (COLACE) 100 MG capsule Take 200 mg by mouth 2 (two) times daily.   06/30/2021   hydrocortisone-pramoxine (ANALPRAM-HC) 2.5-1 % rectal cream Place 1 application rectally as needed for hemorrhoids.   Past Week   NIFEdipine (PROCARDIA XL) 30 MG 24 hr tablet Take 1 tablet (30 mg total) by mouth daily. (Patient taking differently: Take 60 mg by mouth daily.) 60 tablet 3 06/30/2021 at 0900   omeprazole (PRILOSEC) 20 MG capsule Take 20 mg by mouth daily.   06/30/2021   ondansetron (ZOFRAN) 4 MG  tablet Take 1 tablet by mouth as needed for nausea/vomiting.   06/30/2021   polyethylene glycol (MIRALAX / GLYCOLAX) 17 g packet Take 17 g by mouth every other day.   06/30/2021   Prenatal Vit-DSS-Fe Cbn-FA (PRENATAL AD PO) Take 1 tablet by mouth daily. Unknown strenght   06/30/2021   promethazine (PHENERGAN) 25 MG tablet Take 1 tablet by mouth as needed for nausea/vomiting.   06/30/2021    Review of Systems  Constitutional:  Negative for fever.  Eyes:  Negative for photophobia.  Respiratory:  Negative for chest tightness and shortness of breath.   Cardiovascular: Negative.   Genitourinary:  Negative for dysuria and vaginal pain.  Neurological:  Positive for dizziness, light-headedness and headaches.  Physical Exam   Blood pressure (!) 137/95, pulse (!) 110, temperature 98.7 F (37.1 C), temperature source Oral, resp. rate 20, height 5\' 8"  (1.727 m), weight 87.1 kg, last menstrual period 10/25/2020, SpO2 98 %.  Physical Exam Constitutional:      Appearance: Normal appearance.  Skin:    General: Skin is warm.  Neurological:     General: No focal deficit present.     Mental Status: She is alert.  Psychiatric:        Mood and Affect: Mood normal.        Behavior: Behavior normal.    MAU Course  Procedures  MDM -Fioricet reduced HA from 4/10 to a 0/10. She has no blurry vision, floating spots, visual changes, RUQ pain.  -NST: 135 bpm, mod var, present acel, no decels, no contractions -Pre-e labs are normal, no signs of pre-e.  -Patient rested in MAU and better  Assessment and Plan   1. Gestational hypertension, third trimester   -discussed with Dr. 10/27/2020, who recommends close follow-up and consider increasing procardia to 60 xl BID. Discussed with Dr. Despina Hidden, who will have patient come to office on Friday at 10:45 for BP check; will discuss medication at that point -patient stable for discharge; strict pre-e precautions given; patient verbalized understanding. She knows when to  come for BP check.   Tuesday Shriley Joffe 06/30/2021, 4:42 PM

## 2021-06-30 NOTE — Procedures (Signed)
Alicia Nguyen 1986-05-24 [redacted]w[redacted]d  Fetus A Non-Stress Test Interpretation for 06/30/21  Indication: Unsatisfactory BPP  Fetal Heart Rate A Mode: External Baseline Rate (A): 135 bpm Variability: Moderate Accelerations: 15 x 15 Decelerations: None Multiple birth?: No  Uterine Activity Mode: Palpation, Toco Contraction Frequency (min): ui Resting Tone Palpated: Relaxed  Interpretation (Fetal Testing) Nonstress Test Interpretation: Reactive Overall Impression: Reassuring for gestational age Comments: Dr. Judeth Cornfield reviewed tracing

## 2021-06-30 NOTE — MAU Note (Addendum)
Pt sent from MD office for BP evaluation.  States currently has H/A behind left eye that began on Sunday, unrelieved with tylenol.  Endorses floaters, denies epigastric pain.  Also reports "passed out" 3x in past 24 hours (once last night and twice today, no longer than 10 seconds per pt).  Endorses +FM.  Denies VB or LOF. Seen by MFM today for NST/BPP. Reports Hx Pre Eclampsia with previous pregnancy

## 2021-06-30 NOTE — Discharge Instructions (Signed)
-  keep appt on 07/02/2021 at 10:45 am for BP check

## 2021-07-01 LAB — RPR: RPR Ser Ql: NONREACTIVE

## 2021-07-01 LAB — ABO/RH: ABO/RH(D): O POS

## 2021-07-02 DIAGNOSIS — Z3480 Encounter for supervision of other normal pregnancy, unspecified trimester: Secondary | ICD-10-CM | POA: Diagnosis not present

## 2021-07-02 DIAGNOSIS — O133 Gestational [pregnancy-induced] hypertension without significant proteinuria, third trimester: Secondary | ICD-10-CM | POA: Diagnosis not present

## 2021-07-02 DIAGNOSIS — O09522 Supervision of elderly multigravida, second trimester: Secondary | ICD-10-CM | POA: Diagnosis not present

## 2021-07-02 LAB — OB RESULTS CONSOLE GBS: GBS: NEGATIVE

## 2021-07-07 ENCOUNTER — Encounter: Payer: Self-pay | Admitting: *Deleted

## 2021-07-07 ENCOUNTER — Other Ambulatory Visit: Payer: Self-pay

## 2021-07-07 ENCOUNTER — Ambulatory Visit: Payer: BC Managed Care – PPO | Admitting: *Deleted

## 2021-07-07 ENCOUNTER — Ambulatory Visit: Payer: BC Managed Care – PPO | Attending: Obstetrics

## 2021-07-07 VITALS — BP 119/80 | HR 90

## 2021-07-07 DIAGNOSIS — O09523 Supervision of elderly multigravida, third trimester: Secondary | ICD-10-CM | POA: Diagnosis not present

## 2021-07-07 DIAGNOSIS — O10913 Unspecified pre-existing hypertension complicating pregnancy, third trimester: Secondary | ICD-10-CM

## 2021-07-07 DIAGNOSIS — O09293 Supervision of pregnancy with other poor reproductive or obstetric history, third trimester: Secondary | ICD-10-CM

## 2021-07-07 DIAGNOSIS — O10013 Pre-existing essential hypertension complicating pregnancy, third trimester: Secondary | ICD-10-CM

## 2021-07-07 DIAGNOSIS — Z3A36 36 weeks gestation of pregnancy: Secondary | ICD-10-CM

## 2021-07-07 DIAGNOSIS — O403XX0 Polyhydramnios, third trimester, not applicable or unspecified: Secondary | ICD-10-CM

## 2021-07-08 DIAGNOSIS — Z369 Encounter for antenatal screening, unspecified: Secondary | ICD-10-CM | POA: Diagnosis not present

## 2021-07-12 ENCOUNTER — Telehealth (HOSPITAL_COMMUNITY): Payer: Self-pay | Admitting: *Deleted

## 2021-07-12 DIAGNOSIS — Z369 Encounter for antenatal screening, unspecified: Secondary | ICD-10-CM | POA: Diagnosis not present

## 2021-07-12 NOTE — Telephone Encounter (Signed)
Preadmission screen  

## 2021-07-13 ENCOUNTER — Other Ambulatory Visit: Payer: Self-pay | Admitting: Obstetrics and Gynecology

## 2021-07-13 ENCOUNTER — Encounter (HOSPITAL_COMMUNITY): Payer: Self-pay

## 2021-07-13 ENCOUNTER — Encounter (HOSPITAL_COMMUNITY): Payer: Self-pay | Admitting: *Deleted

## 2021-07-13 ENCOUNTER — Telehealth (HOSPITAL_COMMUNITY): Payer: Self-pay | Admitting: *Deleted

## 2021-07-13 NOTE — Telephone Encounter (Signed)
Preadmission screen  

## 2021-07-15 ENCOUNTER — Ambulatory Visit: Payer: BC Managed Care – PPO | Admitting: *Deleted

## 2021-07-15 ENCOUNTER — Other Ambulatory Visit: Payer: Self-pay

## 2021-07-15 ENCOUNTER — Ambulatory Visit: Payer: BC Managed Care – PPO | Attending: Obstetrics

## 2021-07-15 ENCOUNTER — Encounter: Payer: Self-pay | Admitting: *Deleted

## 2021-07-15 VITALS — BP 136/96 | HR 104

## 2021-07-15 DIAGNOSIS — O10913 Unspecified pre-existing hypertension complicating pregnancy, third trimester: Secondary | ICD-10-CM

## 2021-07-15 DIAGNOSIS — O09293 Supervision of pregnancy with other poor reproductive or obstetric history, third trimester: Secondary | ICD-10-CM

## 2021-07-15 DIAGNOSIS — Z3A37 37 weeks gestation of pregnancy: Secondary | ICD-10-CM

## 2021-07-15 DIAGNOSIS — O09513 Supervision of elderly primigravida, third trimester: Secondary | ICD-10-CM

## 2021-07-15 DIAGNOSIS — O403XX0 Polyhydramnios, third trimester, not applicable or unspecified: Secondary | ICD-10-CM

## 2021-07-15 DIAGNOSIS — O10013 Pre-existing essential hypertension complicating pregnancy, third trimester: Secondary | ICD-10-CM

## 2021-07-17 ENCOUNTER — Inpatient Hospital Stay (HOSPITAL_COMMUNITY)
Admission: AD | Admit: 2021-07-17 | Discharge: 2021-07-17 | Disposition: A | Discharge status: Home / Self Care | Payer: BC Managed Care – PPO | Attending: Obstetrics and Gynecology | Admitting: Obstetrics and Gynecology

## 2021-07-17 ENCOUNTER — Other Ambulatory Visit: Payer: Self-pay

## 2021-07-17 ENCOUNTER — Encounter (HOSPITAL_COMMUNITY): Payer: Self-pay | Admitting: Obstetrics and Gynecology

## 2021-07-17 DIAGNOSIS — O368120 Decreased fetal movements, second trimester, not applicable or unspecified: Secondary | ICD-10-CM

## 2021-07-17 DIAGNOSIS — Z3A37 37 weeks gestation of pregnancy: Secondary | ICD-10-CM

## 2021-07-17 DIAGNOSIS — Z3689 Encounter for other specified antenatal screening: Secondary | ICD-10-CM

## 2021-07-17 DIAGNOSIS — R519 Headache, unspecified: Secondary | ICD-10-CM

## 2021-07-17 DIAGNOSIS — O10919 Unspecified pre-existing hypertension complicating pregnancy, unspecified trimester: Secondary | ICD-10-CM

## 2021-07-17 DIAGNOSIS — H5462 Unqualified visual loss, left eye, normal vision right eye: Secondary | ICD-10-CM | POA: Insufficient documentation

## 2021-07-17 DIAGNOSIS — O09523 Supervision of elderly multigravida, third trimester: Secondary | ICD-10-CM | POA: Diagnosis not present

## 2021-07-17 DIAGNOSIS — O26893 Other specified pregnancy related conditions, third trimester: Secondary | ICD-10-CM | POA: Diagnosis not present

## 2021-07-17 DIAGNOSIS — O09293 Supervision of pregnancy with other poor reproductive or obstetric history, third trimester: Secondary | ICD-10-CM | POA: Insufficient documentation

## 2021-07-17 DIAGNOSIS — Z7982 Long term (current) use of aspirin: Secondary | ICD-10-CM | POA: Insufficient documentation

## 2021-07-17 DIAGNOSIS — O368130 Decreased fetal movements, third trimester, not applicable or unspecified: Secondary | ICD-10-CM | POA: Diagnosis not present

## 2021-07-17 DIAGNOSIS — O10913 Unspecified pre-existing hypertension complicating pregnancy, third trimester: Secondary | ICD-10-CM | POA: Insufficient documentation

## 2021-07-17 DIAGNOSIS — Z8249 Family history of ischemic heart disease and other diseases of the circulatory system: Secondary | ICD-10-CM | POA: Diagnosis not present

## 2021-07-17 DIAGNOSIS — Z79899 Other long term (current) drug therapy: Secondary | ICD-10-CM | POA: Insufficient documentation

## 2021-07-17 LAB — COMPREHENSIVE METABOLIC PANEL
ALT: 13 U/L (ref 0–44)
AST: 18 U/L (ref 15–41)
Albumin: 2.7 g/dL — ABNORMAL LOW (ref 3.5–5.0)
Alkaline Phosphatase: 123 U/L (ref 38–126)
Anion gap: 10 (ref 5–15)
BUN: 9 mg/dL (ref 6–20)
CO2: 22 mmol/L (ref 22–32)
Calcium: 9.1 mg/dL (ref 8.9–10.3)
Chloride: 106 mmol/L (ref 98–111)
Creatinine, Ser: 0.77 mg/dL (ref 0.44–1.00)
GFR, Estimated: >60 mL/min (ref >60)
Glucose, Bld: 91 mg/dL (ref 70–99)
Potassium: 3.7 mmol/L (ref 3.5–5.1)
Sodium: 138 mmol/L (ref 135–145)
Total Bilirubin: 0.4 mg/dL (ref 0.3–1.2)
Total Protein: 6.6 g/dL (ref 6.5–8.1)

## 2021-07-17 LAB — CBC
HCT: 33.5 % — ABNORMAL LOW (ref 36.0–46.0)
Hemoglobin: 11.1 g/dL — ABNORMAL LOW (ref 12.0–15.0)
MCH: 29.4 pg (ref 26.0–34.0)
MCHC: 33.1 g/dL (ref 30.0–36.0)
MCV: 88.9 fL (ref 80.0–100.0)
Platelets: 308 10*3/uL (ref 150–400)
RBC: 3.77 MIL/uL — ABNORMAL LOW (ref 3.87–5.11)
RDW: 13.2 % (ref 11.5–15.5)
WBC: 9.6 10*3/uL (ref 4.0–10.5)
nRBC: 0 % (ref 0.0–0.2)

## 2021-07-17 LAB — PROTEIN / CREATININE RATIO, URINE
Creatinine, Urine: 30.73 mg/dL
Total Protein, Urine: <6 mg/dL

## 2021-07-17 MED ORDER — NIFEDIPINE ER OSMOTIC RELEASE 30 MG PO TB24: 30.0000 mg | ORAL_TABLET | Freq: Once | ORAL | Status: DC

## 2021-07-17 MED ORDER — ACETAMINOPHEN 500 MG PO TABS
1000.0000 mg | ORAL_TABLET | Freq: Once | ORAL | Status: AC
  Administered 2021-07-17: 1000 mg via ORAL
  Filled 2021-07-17: qty 2

## 2021-07-17 MED ORDER — CYCLOBENZAPRINE HCL 5 MG PO TABS
10.0000 mg | ORAL_TABLET | Freq: Once | ORAL | Status: AC
  Administered 2021-07-17: 10 mg via ORAL
  Filled 2021-07-17: qty 2

## 2021-07-17 NOTE — MAU Provider Note (Signed)
History     CSN: [id number]  Arrival date and time: 07/17/21 0556   None     Chief Complaint  Patient presents with   Hypertension   Alicia Nguyen is a 35 y.o. G2P1001 at [redacted]w[redacted]d who receives care at [facility].  She presents today for Headache and Decreased Fetal Movement.  She states she has been experiencing a "dull aching" in her head that "feels like eyestrain."  She rates the pain a 4/10 and does not report any relieving or aggravating factors.  She further reports the HA has been present "for weeks."   Patient c/o decreased fetal movement and states that she last felt movement at [facility].  She states she drank juice and water with no improvement, but then started to feel movement upon arrival.  Patient also reports some RUQ pain, but states "it feels like a foot is in my rib."  Patient reports her blood pressure on Tuesday was 150/100, but have otherwise been within range.  She reports taking Procardia 30mg  BID with good results. She denies abdominal cramping or contractions as well as vaginal discharge or bleeding.   OB History     Gravida  2   Para  1   Term  1   Preterm      AB      Living  1      SAB      IAB      Ectopic      Multiple      Live Births  1           Past Medical History:  Diagnosis Date   Hypertension    Preeclampsia    Pregnancy induced hypertension    Pregnant     Past Surgical History:  Procedure Laterality Date   FOOT FRACTURE SURGERY     Optic gioma removal     REFRACTIVE SURGERY      Family History  Problem Relation Age of Onset   Colon polyps Father    Hypertension Father    Diabetes Maternal Grandmother    Skin cancer Maternal Grandmother    CVA Maternal Grandmother    Cervical cancer Maternal Aunt    Dementia Maternal Aunt    Colon cancer Neg Hx    Liver disease Neg Hx    Esophageal cancer Neg Hx    Pancreatic cancer Neg Hx    Stomach cancer Neg Hx     Social History   Tobacco Use   Smoking status:  Never   Smokeless tobacco: Never  Vaping Use   Vaping Use: Never used  Substance Use Topics   Alcohol use: Not Currently   Drug use: Never    Allergies: No Known Allergies  Medications Prior to Admission  Medication Sig Dispense Refill Last Dose   aspirin EC 81 MG tablet Take 81 mg by mouth daily. Swallow whole.   07/16/2021   docusate sodium (COLACE) 100 MG capsule Take 200 mg by mouth 2 (two) times daily.   07/16/2021   hydrocortisone-pramoxine (ANALPRAM-HC) 2.5-1 % rectal cream Place 1 application rectally as needed for hemorrhoids.   Past Week   NIFEdipine (PROCARDIA XL) 30 MG 24 hr tablet Take 1 tablet (30 mg total) by mouth daily. (Patient taking differently: Take 30 mg by mouth 2 (two) times daily.) 60 tablet 3 07/16/2021   omeprazole (PRILOSEC) 20 MG capsule Take 20 mg by mouth daily.   07/16/2021   ondansetron (ZOFRAN) 4 MG tablet Take 1 tablet  by mouth as needed for nausea/vomiting.   07/16/2021   polyethylene glycol (MIRALAX / GLYCOLAX) 17 g packet Take 17 g by mouth every other day.   07/16/2021   Prenatal Vit-DSS-Fe Cbn-FA (PRENATAL AD PO) Take 1 tablet by mouth daily. Unknown strenght   07/16/2021   promethazine (PHENERGAN) 25 MG tablet Take 1 tablet by mouth as needed for nausea/vomiting.   07/16/2021    Review of Systems  Constitutional:  Negative for chills and fever.  Eyes:  Negative for visual disturbance.  Gastrointestinal:  Positive for abdominal pain and constipation (BM Wednesday-hard to pass). Negative for diarrhea, nausea and vomiting.  Genitourinary:  Negative for difficulty urinating, dysuria, vaginal bleeding and vaginal discharge.  Musculoskeletal:  Negative for back pain (Soreness).  Neurological:  Positive for headaches. Negative for dizziness and light-headedness.  Physical Exam   Blood pressure (!) 114/94, pulse 96, temperature 97.9 F (36.6 C), resp. rate 18, height 5\' 8"  (1.727 m), weight 87.5 kg, last menstrual period 10/25/2020, SpO2 99 %.  Vitals:    07/17/21 0715 07/17/21 0730 07/17/21 0745 07/17/21 0800  BP: (!) 129/91 120/90 124/82 (!) 114/94  Pulse: 90 (!) 104 91 96  Resp:      Temp:      SpO2:      Weight:      Height:         Physical Exam Vitals reviewed.  Constitutional:      Appearance: Normal appearance.  HENT:     Head: Normocephalic and atraumatic.  Eyes:     Conjunctiva/sclera: Conjunctivae normal.  Cardiovascular:     Rate and Rhythm: Normal rate.  Abdominal:     Tenderness: There is no abdominal tenderness.     Comments: Gravid, Appears AGA  Musculoskeletal:        General: Normal range of motion.     Cervical back: Normal range of motion.  Neurological:     Mental Status: She is alert and oriented to person, place, and time.  Psychiatric:        Mood and Affect: Mood normal.        Behavior: Behavior normal.        Thought Content: Thought content normal.    Fetal Assessment 140 bpm, Mod Var, -Decels, +Accels Toco: Irregular mild ctx graphed.   MAU Course   Results for orders placed or performed during the hospital encounter of 07/17/21 (from the past 24 hour(s))  Protein / creatinine ratio, urine     Status: None   Collection Time: 07/17/21  6:25 AM  Result Value Ref Range   Creatinine, Urine 30.73 mg/dL   Total Protein, Urine <6 mg/dL   Protein Creatinine Ratio        0.00 - 0.15 mg/mg[Cre]  Comprehensive metabolic panel     Status: Abnormal   Collection Time: 07/17/21  6:31 AM  Result Value Ref Range   Sodium 138 135 - 145 mmol/L   Potassium 3.7 3.5 - 5.1 mmol/L   Chloride 106 98 - 111 mmol/L   CO2 22 22 - 32 mmol/L   Glucose, Bld 91 70 - 99 mg/dL   BUN 9 6 - 20 mg/dL   Creatinine, Ser 07/19/21 0.44 - 1.00 mg/dL   Calcium 9.1 8.9 - 7.90 mg/dL   Total Protein 6.6 6.5 - 8.1 g/dL   Albumin 2.7 (L) 3.5 - 5.0 g/dL   AST 18 15 - 41 U/L   ALT 13 0 - 44 U/L   Alkaline Phosphatase 123 38 -  126 U/L   Total Bilirubin 0.4 0.3 - 1.2 mg/dL   GFR, Estimated >01 >75 mL/min   Anion gap 10 5 - 15   CBC     Status: Abnormal   Collection Time: 07/17/21  6:31 AM  Result Value Ref Range   WBC 9.6 4.0 - 10.5 K/uL   RBC 3.77 (L) 3.87 - 5.11 MIL/uL   Hemoglobin 11.1 (L) 12.0 - 15.0 g/dL   HCT 10.2 (L) 58.5 - 27.7 %   MCV 88.9 80.0 - 100.0 fL   MCH 29.4 26.0 - 34.0 pg   MCHC 33.1 30.0 - 36.0 g/dL   RDW 82.4 23.5 - 36.1 %   Platelets 308 150 - 400 K/uL   nRBC 0.0 0.0 - 0.2 %   No results found.  MDM Physical Exam Labs: CBC, CMP, PC Ratio Measure BPQ15 min EFM Pain Management Assessment and Plan  36 year old G2P1001  SIUP at 37.6 weeks Cat I FT Headache Decreased Fetal Movement  -NST Reactive -PNR Reviewed -Labs results as above. -Provider to bedside to discuss. -Patient endorses fetal movement and states this was her biggest concern today.  -Patient reports HA remains despite tylenol and flexeril dosing. -States this is not uncommon as HA has been present for weeks. 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 -PreE symptoms reviewed. -Keep appt with primary office as scheduled. -Scheduled induction for on Saturday -Encouraged to call primary office or return to MAU if symptoms worsen or with the onset of new symptoms. -Discharged to home in stable condition.  Cherre Robins MSN, CNM 07/17/2021, 8:09 AM

## 2021-07-17 NOTE — MAU Note (Signed)
Have had HTN last couple wks. Last night my b/p was 149/100 and I have not been able to get baby to move like usual. Some RUQ pain that radiates to navel. Denies VB or LOF.

## 2021-07-20 DIAGNOSIS — Z3A38 38 weeks gestation of pregnancy: Secondary | ICD-10-CM | POA: Diagnosis not present

## 2021-07-20 DIAGNOSIS — O133 Gestational [pregnancy-induced] hypertension without significant proteinuria, third trimester: Secondary | ICD-10-CM | POA: Diagnosis not present

## 2021-07-20 DIAGNOSIS — O09522 Supervision of elderly multigravida, second trimester: Secondary | ICD-10-CM | POA: Diagnosis not present

## 2021-07-22 ENCOUNTER — Other Ambulatory Visit: Payer: Self-pay

## 2021-07-22 LAB — SARS CORONAVIRUS 2 (TAT 6-24 HRS): SARS Coronavirus 2: NEGATIVE

## 2021-07-23 ENCOUNTER — Other Ambulatory Visit: Payer: Self-pay

## 2021-07-23 ENCOUNTER — Encounter: Payer: Self-pay | Admitting: Cardiology

## 2021-07-23 ENCOUNTER — Ambulatory Visit (INDEPENDENT_AMBULATORY_CARE_PROVIDER_SITE_OTHER): Payer: BC Managed Care – PPO | Admitting: Cardiology

## 2021-07-23 VITALS — BP 139/92 | HR 94 | Ht 68.0 in | Wt 194.7 lb

## 2021-07-23 DIAGNOSIS — Z8759 Personal history of other complications of pregnancy, childbirth and the puerperium: Secondary | ICD-10-CM | POA: Diagnosis not present

## 2021-07-23 DIAGNOSIS — O09522 Supervision of elderly multigravida, second trimester: Secondary | ICD-10-CM

## 2021-07-23 DIAGNOSIS — O133 Gestational [pregnancy-induced] hypertension without significant proteinuria, third trimester: Secondary | ICD-10-CM

## 2021-07-23 NOTE — Progress Notes (Signed)
Cardio-Obstetrics Clinic  Follow Up Note   Date:  07/23/2021   ID:  Alicia Nguyen, DOB 1986-05-22, MRN [id number]  PCP:  Pcp, No   [facility] Providers Cardiologist:  None  Electrophysiologist:  None        Referring MD: No ref. provider found   Chief Complaint: "I am looking forward to having the baby soon"  History of Present Illness:    Alicia Nguyen is a 35 y.o. female [G2P1000] who returns for follow up due to gestational hypertension.  She is 38 weeks and 5 days plans for induction today.  Patient with a history of pre-eclampsia during her last pregnancy (delivered in August 2021). Also with a history of HTN, for which she took antihypertensives from 2014-2018, but this reportedly resolved after she lost a significant amount of weight.    She is on aspirin 81mg  daily and earlier this pregnancy her blood pressure had been normal. At her initial cardio-ob visit on 03/15/21 her BP was 110/72.  Therefore no medication changes were made.  At her visit on June 05, 2019 she told me that she had been experiencing elevated blood pressure.  And this was concerning.  During that visit her blood pressure was elevated I therefore started the patient on nifedipine 30 mg daily.  In the interim her blood pressure still remain elevated so her OB/GYN increase her nifedipine to 60 mg daily.  She had lightheadedness with the full dose  Prior CV Studies Reviewed: The following studies were reviewed today:  June 07, 2019 of lower extremities 02/15/2021 Examination Guidelines:  A complete evaluation includes B-mode imaging, spectral Doppler, color  Doppler,  and power Doppler as needed of all accessible portions of each vessel.  Bilateral  testing is considered an integral part of a complete examination. Limited  examinations for reoccurring indications may be performed as noted. The  reflux  portion of the exam is performed with the patient in reverse  Trendelenburg.          +---------+---------------+---------+-----------+----------+--------------+   LEFT     CompressibilityPhasicitySpontaneityPropertiesThrombus  Aging  +---------+---------------+---------+-----------+----------+--------------+   CFV      Full           Yes      Yes        patent                      +---------+---------------+---------+-----------+----------+--------------+   SFJ      Full           Yes      Yes        patent                      +---------+---------------+---------+-----------+----------+--------------+   FV Prox  Full           Yes      Yes        patent                      +---------+---------------+---------+-----------+----------+--------------+   FV Mid   Full           Yes      Yes        patent                      +---------+---------------+---------+-----------+----------+--------------+   FV DistalFull           Yes      Yes  patent                      +---------+---------------+---------+-----------+----------+--------------+   PFV      Full           Yes      Yes        patent                      +---------+---------------+---------+-----------+----------+--------------+   POP      Full           Yes      Yes        patent                      +---------+---------------+---------+-----------+----------+--------------+   PTV      Full           Yes      Yes        patent                      +---------+---------------+---------+-----------+----------+--------------+   PERO     Full           Yes      Yes        patent                      +---------+---------------+---------+-----------+----------+--------------+           Left Technical Findings:  No DVT identified. No visible edema entire leg.           *See table(s) above for measurements and observations.   Electronically signed by Thomasene Ripple DO on 02/15/2021 at 12:16:47 PM.         Echocardiogram April 2022 IMPRESSIONS   1. Left ventricular ejection fraction, by estimation, is 60 to 65%. The  left ventricle has normal function. The left ventricle has no regional  wall motion abnormalities. Left ventricular diastolic parameters were  normal.   2. Right ventricular systolic function is normal. The right ventricular  size is normal.   3. The mitral valve is normal in structure. No evidence of mitral valve  regurgitation. No evidence of mitral stenosis.   4. The aortic valve is normal in structure. Aortic valve regurgitation is  not visualized. No aortic stenosis is present.   5. The inferior vena cava is normal in size with greater than 50%  respiratory variability, suggesting right atrial pressure of 3 mmHg.   FINDINGS   Left Ventricle: Left ventricular ejection fraction, by estimation, is 60  to 65%. The left ventricle has normal function. The left ventricle has no  regional wall motion abnormalities. The left ventricular internal cavity  size was normal in size. There is   no left ventricular hypertrophy. Left ventricular diastolic parameters  were normal.   Right Ventricle: The right ventricular size is normal. No increase in  right ventricular wall thickness. Right ventricular systolic function is  normal.   Left Atrium: Left atrial size was normal in size.   Right Atrium: Right atrial size was normal in size.   Pericardium: There is no evidence of pericardial effusion.   Mitral Valve: The mitral valve is normal in structure. No evidence of  mitral valve regurgitation. No evidence of mitral valve stenosis.   Tricuspid Valve: The tricuspid valve is normal in structure. Tricuspid  valve regurgitation is not demonstrated. No evidence of tricuspid  stenosis.   Aortic  Valve: The aortic valve is normal in structure. Aortic valve  regurgitation is not visualized. No aortic stenosis is present.   Pulmonic Valve: The pulmonic valve was normal in  structure. Pulmonic valve  regurgitation is not visualized. No evidence of pulmonic stenosis.   Aorta: The aortic root is normal in size and structure.   Venous: The inferior vena cava is normal in size with greater than 50%  respiratory variability, suggesting right atrial pressure of 3 mmHg.   IAS/Shunts: No atrial level shunt detected by color flow Doppler.        Past Medical History:  Diagnosis Date   Hypertension    Preeclampsia    Pregnancy induced hypertension    Pregnant     Past Surgical History:  Procedure Laterality Date   FOOT FRACTURE SURGERY     Optic gioma removal     REFRACTIVE SURGERY        OB History     Gravida  2   Para  1   Term  1   Preterm  0   AB  0   Living         SAB  0   IAB  0   Ectopic  0   Multiple      Live Births                  Current Medications: Current Meds  Medication Sig   aspirin EC 81 MG tablet Take 81 mg by mouth daily. Swallow whole.   docusate sodium (COLACE) 100 MG capsule Take 200 mg by mouth 2 (two) times daily.   hydrocortisone-pramoxine (ANALPRAM-HC) 2.5-1 % rectal cream Place 1 application rectally as needed for hemorrhoids.   NIFEdipine (PROCARDIA XL) 30 MG 24 hr tablet Take 1 tablet (30 mg total) by mouth daily. (Patient taking differently: Take 30 mg by mouth 2 (two) times daily.)   omeprazole (PRILOSEC) 20 MG capsule Take 20 mg by mouth daily.   ondansetron (ZOFRAN) 4 MG tablet Take 1 tablet by mouth as needed for nausea/vomiting.   polyethylene glycol (MIRALAX / GLYCOLAX) 17 g packet Take 17 g by mouth every other day.   Prenatal Vit-DSS-Fe Cbn-FA (PRENATAL AD PO) Take 1 tablet by mouth daily. Unknown strenght   promethazine (PHENERGAN) 25 MG tablet Take 1 tablet by mouth as needed for nausea/vomiting.     Allergies:   Patient has no known allergies.   Social History   Socioeconomic History   Marital status: Married    Spouse name: Not on file   Number of children: Not on file    Years of education: Not on file   Highest education level: Not on file  Occupational History   Not on file  Tobacco Use   Smoking status: Never   Smokeless tobacco: Never  Vaping Use   Vaping Use: Never used  Substance and Sexual Activity   Alcohol use: Not Currently   Drug use: Never   Sexual activity: Not Currently  Other Topics Concern   Not on file  Social History Narrative   ** Merged History Encounter **       Social Determinants of Health   Financial Resource Strain: Not on file  Food Insecurity: Unknown   Worried About Programme researcher, broadcasting/film/video in the Last Year: Never true   Ran Out of Food in the Last Year: Not on file  Transportation Needs: Unknown   Lack of Transportation (Medical): No   Lack of Transportation (Non-Medical):  Not on file  Physical Activity: Not on file  Stress: Not on file  Social Connections: Not on file      Family History  Problem Relation Age of Onset   Colon polyps Father    Hypertension Father    Diabetes Maternal Grandmother    Skin cancer Maternal Grandmother    CVA Maternal Grandmother    Cervical cancer Maternal Aunt    Dementia Maternal Aunt    Colon cancer Neg Hx    Liver disease Neg Hx    Esophageal cancer Neg Hx    Pancreatic cancer Neg Hx    Stomach cancer Neg Hx       ROS:   Please see the history of present illness.     All other systems reviewed and are negative.   Labs/EKG Reviewed:    EKG:   EKG is was not ordered today.    Recent Labs: 07/17/2021: ALT 13; BUN 9; Creatinine, Ser 0.77; Hemoglobin 11.1; Platelets 308; Potassium 3.7; Sodium 138   Recent Lipid Panel No results found for: CHOL, TRIG, HDL, CHOLHDL, LDLCALC, LDLDIRECT  Physical Exam:    VS:  BP (!) 139/92 (BP Location: Left Arm, Patient Position: Sitting)   Pulse 94   Ht 5\' 8"  (1.727 m)   Wt 194 lb 11.2 oz (88.3 kg)   LMP 10/25/2020   SpO2 99%   BMI 29.60 kg/m     Wt Readings from Last 3 Encounters:  07/23/21 194 lb 11.2 oz (88.3 kg)   07/17/21 193 lb (87.5 kg)  06/30/21 192 lb (87.1 kg)     GEN:  Well nourished, well developed in no acute distress HEENT: Normal NECK: No JVD; No carotid bruits LYMPHATICS: No lymphadenopathy CARDIAC: RRR, no murmurs, rubs, gallops RESPIRATORY:  Clear to auscultation without rales, wheezing or rhonchi  ABDOMEN: Soft, non-tender, non-distended MUSCULOSKELETAL:  No edema; No deformity  SKIN: Warm and dry NEUROLOGIC:  Alert and oriented x 3 PSYCHIATRIC:  Normal affect    Risk Assessment/Risk Calculators:     CARPREG II Risk Prediction Index Score:  1.  The patient's risk for a primary cardiac event is 5%.            ASSESSMENT & PLAN:    Gestational hypertension History of preeclampsia  We will continue patient on her nifedipine which is dosed at 30 mg twice a day.  At higher dose she could lightheaded.  Is very exciting she is planned for induction this evening.  Her blood pressure is slightly elevated in the office today but for right now we will just keep her on her current dose of antihypertensive medication.  I plan to see the patient 1 week postpartum.  In the meantime she will take her blood pressure daily and notify my office right away if her blood pressure is greater than 140/90 during the time leading up to her 1 week follow-up.    The goal will be to help manage her blood pressure postpartum and once she is out of the high risk window 07/02/21 3 months postpartum I will have the patient transition back to her primary cardiologist Dr. Lanora Manis.   Patient Instructions  Medication Instructions:  Your physician recommends that you continue on your current medications as directed. Please refer to the Current Medication list given to you today.  *If you need a refill on your cardiac medications before your next appointment, please call your pharmacy*   Lab Work: None If you have labs (blood work) drawn today  and your tests are completely normal, you will receive  your results only by: MyChart Message (if you have MyChart) OR A paper copy in the mail If you have any lab test that is abnormal or we need to change your treatment, we will call you to review the results.   Testing/Procedures: None   Follow-Up: At [facility], you and your health needs are our priority.  As part of our continuing mission to provide you with exceptional heart care, we have created designated Provider Care Teams.  These Care Teams include your primary Cardiologist (physician) and Advanced Practice Providers (APPs -  Physician Assistants and Nurse Practitioners) who all work together to provide you with the care you need, when you need it.  We recommend signing up for the patient portal called "MyChart".  Sign up information is provided on this After Visit Summary.  MyChart is used to connect with patients for Virtual Visits (Telemedicine).  Patients are able to view lab/test results, encounter notes, upcoming appointments, etc.  Non-urgent messages can be sent to your provider as well.   To learn more about what you can do with MyChart, go to [email or web address].    Your next appointment:    Oct 4th at [facility]  The format for your next appointment:   Virtual Visit   Provider:   Thomasene Ripple, DO [address], [place], [place] [address]    Other Instructions     Dispo:  No follow-ups on file.   Medication Adjustments/Labs and Tests Ordered: Current medicines are reviewed at length with the patient today.  Concerns regarding medicines are outlined above.  Tests Ordered: No orders of the defined types were placed in this encounter.  Medication Changes: No orders of the defined types were placed in this encounter.

## 2021-07-23 NOTE — Patient Instructions (Signed)
Medication Instructions:  Your physician recommends that you continue on your current medications as directed. Please refer to the Current Medication list given to you today.  *If you need a refill on your cardiac medications before your next appointment, please call your pharmacy*   Lab Work: None If you have labs (blood work) drawn today and your tests are completely normal, you will receive your results only by: MyChart Message (if you have MyChart) OR A paper copy in the mail If you have any lab test that is abnormal or we need to change your treatment, we will call you to review the results.   Testing/Procedures: None   Follow-Up: At [facility], you and your health needs are our priority.  As part of our continuing mission to provide you with exceptional heart care, we have created designated Provider Care Teams.  These Care Teams include your primary Cardiologist (physician) and Advanced Practice Providers (APPs -  Physician Assistants and Nurse Practitioners) who all work together to provide you with the care you need, when you need it.  We recommend signing up for the patient portal called "MyChart".  Sign up information is provided on this After Visit Summary.  MyChart is used to connect with patients for Virtual Visits (Telemedicine).  Patients are able to view lab/test results, encounter notes, upcoming appointments, etc.  Non-urgent messages can be sent to your provider as well.   To learn more about what you can do with MyChart, go to [email or web address].    Your next appointment:    Oct 4th at [facility]  The format for your next appointment:   Virtual Visit   Provider:   Thomasene Ripple, DO [address], [place], [place] [address]    Other Instructions

## 2021-07-24 ENCOUNTER — Encounter (HOSPITAL_COMMUNITY): Payer: Self-pay | Admitting: Obstetrics and Gynecology

## 2021-07-24 ENCOUNTER — Inpatient Hospital Stay (HOSPITAL_COMMUNITY): Payer: BC Managed Care – PPO | Admitting: Anesthesiology

## 2021-07-24 ENCOUNTER — Inpatient Hospital Stay (HOSPITAL_COMMUNITY)
Admission: AD | Admit: 2021-07-24 | Discharge: 2021-07-26 | DRG: 807 | Disposition: A | Discharge status: Home / Self Care | Payer: BC Managed Care – PPO | Attending: Obstetrics and Gynecology | Admitting: Obstetrics and Gynecology

## 2021-07-24 ENCOUNTER — Inpatient Hospital Stay (HOSPITAL_COMMUNITY): Payer: BC Managed Care – PPO

## 2021-07-24 DIAGNOSIS — O10919 Unspecified pre-existing hypertension complicating pregnancy, unspecified trimester: Secondary | ICD-10-CM | POA: Diagnosis present

## 2021-07-24 DIAGNOSIS — D649 Anemia, unspecified: Secondary | ICD-10-CM | POA: Diagnosis not present

## 2021-07-24 DIAGNOSIS — O9902 Anemia complicating childbirth: Secondary | ICD-10-CM | POA: Diagnosis not present

## 2021-07-24 DIAGNOSIS — Z3A38 38 weeks gestation of pregnancy: Secondary | ICD-10-CM

## 2021-07-24 DIAGNOSIS — O09523 Supervision of elderly multigravida, third trimester: Secondary | ICD-10-CM | POA: Diagnosis not present

## 2021-07-24 DIAGNOSIS — O1092 Unspecified pre-existing hypertension complicating childbirth: Secondary | ICD-10-CM | POA: Diagnosis not present

## 2021-07-24 DIAGNOSIS — O1002 Pre-existing essential hypertension complicating childbirth: Secondary | ICD-10-CM | POA: Diagnosis not present

## 2021-07-24 HISTORY — DX: Unspecified pre-existing hypertension complicating pregnancy, unspecified trimester: O10.919

## 2021-07-24 LAB — CBC
HCT: 31.2 % — ABNORMAL LOW (ref 36.0–46.0)
Hemoglobin: 10.5 g/dL — ABNORMAL LOW (ref 12.0–15.0)
MCH: 29.7 pg (ref 26.0–34.0)
MCHC: 33.7 g/dL (ref 30.0–36.0)
MCV: 88.4 fL (ref 80.0–100.0)
Platelets: 308 10*3/uL (ref 150–400)
RBC: 3.53 MIL/uL — ABNORMAL LOW (ref 3.87–5.11)
RDW: 13.6 % (ref 11.5–15.5)
WBC: 10.3 10*3/uL (ref 4.0–10.5)
nRBC: 0 % (ref 0.0–0.2)

## 2021-07-24 LAB — RPR: RPR Ser Ql: NONREACTIVE

## 2021-07-24 LAB — TYPE AND SCREEN
ABO/RH(D): O POS
Antibody Screen: NEGATIVE

## 2021-07-24 MED ORDER — FLEET ENEMA 7-19 GM/118ML RE ENEM: 1.0000 | ENEMA | RECTAL | Status: DC | PRN

## 2021-07-24 MED ORDER — FERROUS SULFATE 325 (65 FE) MG PO TABS
325.0000 mg | ORAL_TABLET | Freq: Two times a day (BID) | ORAL | Status: DC
  Administered 2021-07-24 – 2021-07-26 (×4): 325 mg via ORAL
  Filled 2021-07-24 (×4): qty 1

## 2021-07-24 MED ORDER — OXYTOCIN-SODIUM CHLORIDE 30-0.9 UT/500ML-% IV SOLN
1.0000 m[IU]/min | INTRAVENOUS | Status: DC
  Administered 2021-07-24: 2 m[IU]/min via INTRAVENOUS

## 2021-07-24 MED ORDER — ACETAMINOPHEN 325 MG PO TABS
650.0000 mg | ORAL_TABLET | ORAL | Status: DC | PRN
  Administered 2021-07-25: 650 mg via ORAL
  Filled 2021-07-24: qty 2

## 2021-07-24 MED ORDER — SOD CITRATE-CITRIC ACID 500-334 MG/5ML PO SOLN: 30.0000 mL | ORAL | Status: DC | PRN

## 2021-07-24 MED ORDER — METHYLERGONOVINE MALEATE 0.2 MG PO TABS: 0.2000 mg | ORAL_TABLET | ORAL | Status: DC | PRN

## 2021-07-24 MED ORDER — ONDANSETRON HCL 4 MG PO TABS: 4.0000 mg | ORAL_TABLET | ORAL | Status: DC | PRN

## 2021-07-24 MED ORDER — TETANUS-DIPHTH-ACELL PERTUSSIS 5-2.5-18.5 LF-MCG/0.5 IM SUSY: 0.5000 mL | PREFILLED_SYRINGE | Freq: Once | INTRAMUSCULAR | Status: DC

## 2021-07-24 MED ORDER — PHENYLEPHRINE 40 MCG/ML (10ML) SYRINGE FOR IV PUSH (FOR BLOOD PRESSURE SUPPORT): 80.0000 ug | PREFILLED_SYRINGE | INTRAVENOUS | Status: DC | PRN

## 2021-07-24 MED ORDER — LACTATED RINGERS IV SOLN
500.0000 mL | Freq: Once | INTRAVENOUS | Status: AC
  Administered 2021-07-24: 500 mL via INTRAVENOUS

## 2021-07-24 MED ORDER — MAGNESIUM HYDROXIDE 400 MG/5ML PO SUSP: 30.0000 mL | ORAL | Status: DC | PRN

## 2021-07-24 MED ORDER — TERBUTALINE SULFATE 1 MG/ML IJ SOLN: 0.2500 mg | Freq: Once | INTRAMUSCULAR | Status: DC | PRN

## 2021-07-24 MED ORDER — OXYTOCIN-SODIUM CHLORIDE 30-0.9 UT/500ML-% IV SOLN
2.5000 [IU]/h | INTRAVENOUS | Status: DC
  Administered 2021-07-24: 2.5 [IU]/h via INTRAVENOUS
  Filled 2021-07-24: qty 500

## 2021-07-24 MED ORDER — FENTANYL-BUPIVACAINE-NACL 0.5-0.125-0.9 MG/250ML-% EP SOLN
12.0000 mL/h | EPIDURAL | Status: DC | PRN
  Administered 2021-07-24: 12 mL/h via EPIDURAL
  Filled 2021-07-24: qty 250

## 2021-07-24 MED ORDER — ZOLPIDEM TARTRATE 5 MG PO TABS: 5.0000 mg | ORAL_TABLET | Freq: Every evening | ORAL | Status: DC | PRN

## 2021-07-24 MED ORDER — HYDROCORT-PRAMOXINE (PERIANAL) 2.5-1 % EX CREA
1.0000 "application " | TOPICAL_CREAM | CUTANEOUS | Status: DC | PRN
  Filled 2021-07-24: qty 30

## 2021-07-24 MED ORDER — COCONUT OIL OIL: 1.0000 "application " | TOPICAL_OIL | Status: DC | PRN

## 2021-07-24 MED ORDER — SODIUM CHLORIDE 0.9% FLUSH
3.0000 mL | Freq: Two times a day (BID) | INTRAVENOUS | Status: DC
  Administered 2021-07-24: 3 mL via INTRAVENOUS

## 2021-07-24 MED ORDER — OXYCODONE-ACETAMINOPHEN 5-325 MG PO TABS: 2.0000 | ORAL_TABLET | ORAL | Status: DC | PRN

## 2021-07-24 MED ORDER — WITCH HAZEL-GLYCERIN EX PADS: 1.0000 "application " | MEDICATED_PAD | CUTANEOUS | Status: DC | PRN

## 2021-07-24 MED ORDER — BENZOCAINE-MENTHOL 20-0.5 % EX AERO
1.0000 "application " | INHALATION_SPRAY | CUTANEOUS | Status: DC | PRN
  Administered 2021-07-24: 1 via TOPICAL
  Filled 2021-07-24: qty 56

## 2021-07-24 MED ORDER — MISOPROSTOL 25 MCG QUARTER TABLET
25.0000 ug | ORAL_TABLET | ORAL | Status: DC | PRN
  Administered 2021-07-24 (×2): 25 ug via VAGINAL
  Filled 2021-07-24 (×2): qty 1

## 2021-07-24 MED ORDER — SODIUM CHLORIDE 0.9% FLUSH: 3.0000 mL | INTRAVENOUS | Status: DC | PRN

## 2021-07-24 MED ORDER — ONDANSETRON HCL 4 MG/2ML IJ SOLN
4.0000 mg | Freq: Four times a day (QID) | INTRAMUSCULAR | Status: DC | PRN
  Administered 2021-07-24: 4 mg via INTRAVENOUS
  Filled 2021-07-24: qty 2

## 2021-07-24 MED ORDER — ONDANSETRON HCL 4 MG/2ML IJ SOLN: 4.0000 mg | INTRAMUSCULAR | Status: DC | PRN

## 2021-07-24 MED ORDER — ACETAMINOPHEN 325 MG PO TABS: 650.0000 mg | ORAL_TABLET | ORAL | Status: DC | PRN

## 2021-07-24 MED ORDER — EPHEDRINE 5 MG/ML INJ: 10.0000 mg | INTRAVENOUS | Status: DC | PRN

## 2021-07-24 MED ORDER — LACTATED RINGERS IV SOLN: INTRAVENOUS | Status: DC

## 2021-07-24 MED ORDER — LIDOCAINE HCL (PF) 1 % IJ SOLN: 30.0000 mL | INTRAMUSCULAR | Status: DC | PRN

## 2021-07-24 MED ORDER — LIDOCAINE HCL (PF) 1 % IJ SOLN
INTRAMUSCULAR | Status: DC | PRN
  Administered 2021-07-24: 5 mL via EPIDURAL
  Administered 2021-07-24: 4 mL via EPIDURAL

## 2021-07-24 MED ORDER — DIPHENHYDRAMINE HCL 25 MG PO CAPS: 25.0000 mg | ORAL_CAPSULE | Freq: Four times a day (QID) | ORAL | Status: DC | PRN

## 2021-07-24 MED ORDER — SIMETHICONE 80 MG PO CHEW
80.0000 mg | CHEWABLE_TABLET | ORAL | Status: DC | PRN
  Filled 2021-07-24: qty 1

## 2021-07-24 MED ORDER — MEASLES, MUMPS & RUBELLA VAC IJ SOLR: 0.5000 mL | Freq: Once | INTRAMUSCULAR | Status: DC

## 2021-07-24 MED ORDER — METHYLERGONOVINE MALEATE 0.2 MG/ML IJ SOLN
0.2000 mg | INTRAMUSCULAR | Status: DC | PRN
Start: 2021-07-24 — End: 2021-07-26

## 2021-07-24 MED ORDER — SENNOSIDES-DOCUSATE SODIUM 8.6-50 MG PO TABS
2.0000 | ORAL_TABLET | Freq: Every day | ORAL | Status: DC
  Administered 2021-07-25 – 2021-07-26 (×2): 2 via ORAL
  Filled 2021-07-24 (×2): qty 2

## 2021-07-24 MED ORDER — LACTATED RINGERS IV SOLN: 500.0000 mL | INTRAVENOUS | Status: DC | PRN

## 2021-07-24 MED ORDER — OXYTOCIN BOLUS FROM INFUSION
333.0000 mL | Freq: Once | INTRAVENOUS | Status: AC
  Administered 2021-07-24: 333 mL via INTRAVENOUS

## 2021-07-24 MED ORDER — DIPHENHYDRAMINE HCL 50 MG/ML IJ SOLN: 12.5000 mg | INTRAMUSCULAR | Status: DC | PRN

## 2021-07-24 MED ORDER — PRENATAL MULTIVITAMIN CH
1.0000 | ORAL_TABLET | Freq: Every day | ORAL | Status: DC
  Administered 2021-07-25 – 2021-07-26 (×2): 1 via ORAL
  Filled 2021-07-24 (×2): qty 1

## 2021-07-24 MED ORDER — DIPHENHYDRAMINE HCL 25 MG PO CAPS
25.0000 mg | ORAL_CAPSULE | Freq: Once | ORAL | Status: AC
  Administered 2021-07-24: 25 mg via ORAL
  Filled 2021-07-24: qty 1

## 2021-07-24 MED ORDER — SODIUM CHLORIDE 0.9 % IV SOLN: 250.0000 mL | INTRAVENOUS | Status: DC | PRN

## 2021-07-24 MED ORDER — DIBUCAINE (PERIANAL) 1 % EX OINT
1.0000 "application " | TOPICAL_OINTMENT | CUTANEOUS | Status: DC | PRN
  Administered 2021-07-24: 1 via RECTAL
  Filled 2021-07-24: qty 28

## 2021-07-24 MED ORDER — IBUPROFEN 800 MG PO TABS
800.0000 mg | ORAL_TABLET | Freq: Three times a day (TID) | ORAL | Status: DC
  Administered 2021-07-24 – 2021-07-25 (×5): 800 mg via ORAL
  Filled 2021-07-24 (×5): qty 1

## 2021-07-24 MED ORDER — OXYCODONE-ACETAMINOPHEN 5-325 MG PO TABS: 1.0000 | ORAL_TABLET | ORAL | Status: DC | PRN

## 2021-07-24 NOTE — Progress Notes (Signed)
   07/24/21 1856  Vital Signs  BP 122/89 (150/95)  Dr Henderson Cloud notified of BP 135/85. Dr Stated to call for bp 150/95

## 2021-07-24 NOTE — Anesthesia Procedure Notes (Signed)
Epidural Patient location during procedure: OB Start time: 07/24/2021 10:05 AM End time: 07/24/2021 10:13 AM  Staffing Anesthesiologist: Mal Amabile, MD Performed: anesthesiologist   Preanesthetic Checklist Completed: patient identified, IV checked, site marked, risks and benefits discussed, surgical consent, monitors and equipment checked, pre-op evaluation and timeout performed  Epidural Patient position: sitting Prep: DuraPrep and site prepped and draped Patient monitoring: continuous pulse ox and blood pressure Approach: midline Location: L3-L4 Injection technique: LOR air  Needle:  Needle type: Tuohy  Needle gauge: 17 G Needle length: 9 cm and 9 Needle insertion depth: 5 cm Catheter type: closed end flexible Catheter size: 19 Gauge Catheter at skin depth: 10 cm Test dose: negative  Assessment Events: blood not aspirated, injection not painful, no injection resistance, no paresthesia and negative IV test  Additional Notes Patient identified. Risks and benefits discussed including failed block, incomplete  Pain control, post dural puncture headache, nerve damage, paralysis, blood pressure Changes, nausea, vomiting, reactions to medications-both toxic and allergic and post Partum back pain. All questions were answered. Patient expressed understanding and wished to proceed. Sterile technique was used throughout procedure. Epidural site was Dressed with sterile barrier dressing. No paresthesias, signs of intravascular injection Or signs of intrathecal spread were encountered.  Patient was more comfortable after the epidural was dosed. Please see RN's note for documentation of vital signs and FHR which are stable. Reason for block:procedure for pain

## 2021-07-24 NOTE — H&P (Signed)
35 y.o. [redacted]w[redacted]d  G2P1000 comes in for induction at term for AMA and CHTN.  Otherwise has good fetal movement and no bleeding.  Past Medical History:  Diagnosis Date   Hypertension    Preeclampsia    Pregnancy induced hypertension    Pregnant     Past Surgical History:  Procedure Laterality Date   FOOT FRACTURE SURGERY     Optic gioma removal     REFRACTIVE SURGERY      OB History  Gravida Para Term Preterm AB Living  2 1 1  0 0    SAB IAB Ectopic Multiple Live Births  0 0 0        # Outcome Date GA Lbr Len/2nd Weight Sex Delivery Anes PTL Lv  2 Current           1 Term 06/24/20 [redacted]w[redacted]d    Vag-Spont       Social History   Socioeconomic History   Marital status: Married    Spouse name: Not on file   Number of children: Not on file   Years of education: Not on file   Highest education level: Not on file  Occupational History   Not on file  Tobacco Use   Smoking status: Never   Smokeless tobacco: Never  Vaping Use   Vaping Use: Never used  Substance and Sexual Activity   Alcohol use: Not Currently   Drug use: Never   Sexual activity: Not Currently  Other Topics Concern   Not on file  Social History Narrative   ** Merged History Encounter **       Social Determinants of Health   Financial Resource Strain: Not on file  Food Insecurity: Unknown   Worried About [redacted]w[redacted]d in the Last Year: Never true   Ran Out of Food in the Last Year: Not on file  Transportation Needs: Unknown   Lack of Transportation (Medical): No   Lack of Transportation (Non-Medical): Not on file  Physical Activity: Not on file  Stress: Not on file  Social Connections: Not on file  Intimate Partner Violence: Not on file   Patient has no known allergies.    Prenatal Transfer Tool  Maternal Diabetes: No Genetic Screening: Normal Maternal Ultrasounds/Referrals: Normal Fetal Ultrasounds or other Referrals:  Referred to Materal Fetal Medicine for CHTN Maternal Substance Abuse:   No Significant Maternal Medications:  Meds include: Nexium Other: Procardia, Valtrex Significant Maternal Lab Results: Group B Strep negative  Other PNC: uncomplicated.    Vitals:   07/24/21 0300 07/24/21 0401 07/24/21 0500 07/24/21 0601  BP: 120/82 101/64 111/84 122/81  Pulse: 85 82 86 81  Resp:   16 16  Temp:      TempSrc:      SpO2:      Weight:      Height:        Lungs/Cor:  NAD Abdomen:  soft, gravid Ex:  no cords, erythema SVE:  3/40/-2 SSE: neg for lesions FHTs:  120s, good STV, NST R; Cat 1 tracing. Toco:  q occ   A/P   Term CHTN and AMA, induction.    GBS neg.  07/26/21

## 2021-07-24 NOTE — Progress Notes (Signed)
Holding procardia for now, will watch pp bps and decide if needs later.

## 2021-07-24 NOTE — Lactation Note (Signed)
This note was copied from a baby's chart. Lactation Consultation Note  Patient Name: Alicia Nguyen [id number] Date: 07/24/2021 Reason for consult: L&D Initial assessment;Mother's request;Early term 37-38.6wks (PIH Procardia) Age < 1 hr Infant feeding in cross cradle position with increase in depth of swallows with breast compression. Mom experienced with breastfeeding.  Mom to receive further LC support on the floor.   Maternal Data Has patient been taught Hand Expression?: Yes Does the patient have breastfeeding experience prior to this delivery?: Yes How long did the patient breastfeed?: 6 months  Feeding Mother's Current Feeding Choice: Breast Milk  LATCH Score Latch: Repeated attempts needed to sustain latch, nipple held in mouth throughout feeding, stimulation needed to elicit sucking reflex.  Audible Swallowing: Spontaneous and intermittent  Type of Nipple: Everted at rest and after stimulation  Comfort (Breast/Nipple): Soft / non-tender  Hold (Positioning): Assistance needed to correctly position infant at breast and maintain latch.  LATCH Score: 8   Lactation Tools Discussed/Used    Interventions Interventions: Breast feeding basics reviewed;Breast compression;Assisted with latch;Adjust position;Skin to skin;Hand express;Expressed milk;Education  Discharge    Consult Status Consult Status: Follow-up from L&D Date: 07/25/21 Follow-up type: In-patient    Alicia Nguyen  Alicia Nguyen 07/24/2021, 12:22 PM

## 2021-07-24 NOTE — Anesthesia Preprocedure Evaluation (Signed)
Anesthesia Evaluation  Patient identified by MRN, date of birth, ID band Patient awake    Reviewed: Allergy & Precautions, Patient's Chart, lab work & pertinent test results  Airway Mallampati: II  TM Distance: >3 FB Neck ROM: Full    Dental  (+) Teeth Intact, Dental Advisory Given   Pulmonary neg pulmonary ROS,    Pulmonary exam normal breath sounds clear to auscultation       Cardiovascular hypertension, Normal cardiovascular exam Rhythm:Regular Rate:Normal  Hx/o Pre eclampsia with prior pregnancy   Neuro/Psych negative neurological ROS  negative psych ROS   GI/Hepatic Neg liver ROS, GERD  ,  Endo/Other  negative endocrine ROS  Renal/GU negative Renal ROS  negative genitourinary   Musculoskeletal negative musculoskeletal ROS (+)   Abdominal (+) - obese,   Peds  Hematology  (+) anemia ,   Anesthesia Other Findings   Reproductive/Obstetrics (+) Pregnancy                             Anesthesia Physical Anesthesia Plan  ASA: 2  Anesthesia Plan: Epidural   Post-op Pain Management:    Induction:   PONV Risk Score and Plan:   Airway Management Planned: Natural Airway  Additional Equipment:   Intra-op Plan:   Post-operative Plan:   Informed Consent: I have reviewed the patients History and Physical, chart, labs and discussed the procedure including the risks, benefits and alternatives for the proposed anesthesia with the patient or authorized representative who has indicated his/her understanding and acceptance.       Plan Discussed with: Anesthesiologist  Anesthesia Plan Comments:         Anesthesia Quick Evaluation

## 2021-07-25 LAB — CBC
HCT: 27.8 % — ABNORMAL LOW (ref 36.0–46.0)
Hemoglobin: 9.1 g/dL — ABNORMAL LOW (ref 12.0–15.0)
MCH: 29.2 pg (ref 26.0–34.0)
MCHC: 32.7 g/dL (ref 30.0–36.0)
MCV: 89.1 fL (ref 80.0–100.0)
Platelets: 242 10*3/uL (ref 150–400)
RBC: 3.12 MIL/uL — ABNORMAL LOW (ref 3.87–5.11)
RDW: 13.8 % (ref 11.5–15.5)
WBC: 11.2 10*3/uL — ABNORMAL HIGH (ref 4.0–10.5)
nRBC: 0 % (ref 0.0–0.2)

## 2021-07-25 MED ORDER — NIFEDIPINE ER OSMOTIC RELEASE 30 MG PO TB24
30.0000 mg | ORAL_TABLET | Freq: Every day | ORAL | Status: DC
  Administered 2021-07-25 – 2021-07-26 (×2): 30 mg via ORAL
  Filled 2021-07-25 (×2): qty 1

## 2021-07-25 NOTE — Anesthesia Postprocedure Evaluation (Signed)
Anesthesia Post Note  Patient: Alicia Nguyen  Procedure(s) Performed: AN AD HOC LABOR EPIDURAL     Patient location during evaluation: Mother Baby Anesthesia Type: Epidural Level of consciousness: awake and alert Pain management: pain level controlled Vital Signs Assessment: post-procedure vital signs reviewed and stable Respiratory status: spontaneous breathing, nonlabored ventilation and respiratory function stable Cardiovascular status: stable Postop Assessment: no headache, no backache and epidural receding Anesthetic complications: no   No notable events documented.  Last Vitals:  Vitals:   07/24/21 2339 07/25/21 0517  BP: 121/88 (!) 129/95  Pulse: 75 76  Resp: 18 18  Temp: 36.9 C 37 C  SpO2: 99% 100%    Last Pain:  Vitals:   07/25/21 0517  TempSrc: Oral  PainSc: 6    Pain Goal:                   [facility] Records

## 2021-07-25 NOTE — Progress Notes (Signed)
   07/25/21 1758  Vital Signs  BP (!) 130/92  Pulse Rate 87  Temp 98.1 F (36.7 C)  Oxygen Therapy  SpO2 100 %  MD had requested to not call unless bp was >150/95

## 2021-07-25 NOTE — Progress Notes (Addendum)
Patient is eating, ambulating, voiding.  Pain control is good.  Vitals:   07/24/21 1744 07/24/21 1856 07/24/21 2339 07/25/21 0517  BP: 121/88 122/89 121/88 (!) 129/95  Pulse: 77 72 75 76  Resp: 18 18 18 18   Temp: 98.3 F (36.8 C) 98.6 F (37 C) 98.5 F (36.9 C) 98.6 F (37 C)  TempSrc: Oral  Oral Oral  SpO2: 99% 99% 99% 100%  Weight:      Height:        Fundus firm Perineum without swelling.  Lab Results  Component Value Date   WBC 11.2 (H) 07/25/2021   HGB 9.1 (L) 07/25/2021   HCT 27.8 (L) 07/25/2021   MCV 89.1 07/25/2021   PLT 242 07/25/2021    --/--/O POS (09/24 0055)/RI  A/P Post partum day 1, GV pt.  CHTN, did not yet start pt back on Procardia.  BPs have been normal systolics but diastolics in 90s.  Procardia 30 mg XL starting today.  Routine care.  Expect d/c tomorrow.   Parents desires circumsision.  All risks, benefits and alternatives discussed with the mother.  Parents desires circumsision.  All risks, benefits and alternatives discussed with the mother.   07-11-1976

## 2021-07-26 MED ORDER — IBUPROFEN 600 MG PO TABS
600.0000 mg | ORAL_TABLET | Freq: Four times a day (QID) | ORAL | Status: DC | PRN
  Administered 2021-07-26: 600 mg via ORAL
  Filled 2021-07-26: qty 1

## 2021-07-26 MED ORDER — NIFEDIPINE ER OSMOTIC RELEASE 30 MG PO TB24: 30.0000 mg | ORAL_TABLET | Freq: Every day | ORAL | 3 refills | Status: DC

## 2021-07-26 MED ORDER — IBUPROFEN 600 MG PO TABS: 600.0000 mg | ORAL_TABLET | Freq: Four times a day (QID) | ORAL | 0 refills | Status: DC | PRN

## 2021-07-26 NOTE — Discharge Summary (Signed)
Postpartum Discharge Summary      Patient Name: Alicia Nguyen DOB: [birth year] MRN: [id number]  Date of admission: 07/24/2021 Delivery date:07/24/2021  Delivering provider: Carrington Clamp  Date of discharge: 07/26/2021  Admitting diagnosis: Chronic hypertension affecting pregnancy [O10.919] Intrauterine pregnancy: [redacted]w[redacted]d     Secondary diagnosis:  Active Problems:   Chronic hypertension affecting pregnancy    Discharge diagnosis: Term Pregnancy Delivered and [facility] course: Induction of Labor With Vaginal Delivery   35 y.o. yo G2P2001 at [redacted]w[redacted]d was admitted to the hospital 07/24/2021 for induction of labor.  Indication for induction:  CHTN, AMA .  Patient had an uncomplicated labor course as follows: Membrane Rupture Time/Date: 8:05 AM ,07/24/2021   Delivery Method:Vaginal, Spontaneous  Episiotomy: None  Lacerations:  1st degree;Perineal;Labial  Details of delivery can be found in separate delivery note.  Patient had a routine postpartum course. BP controlled with Procardia XL 30 mg daily.  Patient is discharged home 07/26/21.  Newborn Data: Birth date:07/24/2021  Birth time:11:19 AM  Gender:Female  Living status:Living  Apgars:9 ,9  Weight:3714 g    Physical exam  Vitals:   07/25/21 1427 07/25/21 1758 07/25/21 2051 07/26/21 0552  BP: 120/90 (!) 130/92 125/82 108/78  Pulse: 72 87 72 78  Resp:      Temp: 98 F (36.7 C) 98.1 F (36.7 C)    TempSrc:      SpO2: 100% 100%    Weight:      Height:       General: alert Lochia: appropriate Uterine Fundus: firm  Labs: Lab Results  Component Value Date   WBC 11.2 (H) 07/25/2021   HGB 9.1 (L) 07/25/2021   HCT 27.8 (L) 07/25/2021   MCV 89.1 07/25/2021   PLT 242 07/25/2021   CMP Latest Ref Rng & Units 07/17/2021  Glucose 70 - 99 mg/dL 91  BUN 6 - 20 mg/dL 9  Creatinine 0.81 - 4.48 mg/dL 1.85  Sodium 631 - 497 mmol/L 138  Potassium 3.5 - 5.1 mmol/L 3.7  Chloride 98 -  111 mmol/L 106  CO2 22 - 32 mmol/L 22  Calcium 8.9 - 10.3 mg/dL 9.1  Total Protein 6.5 - 8.1 g/dL 6.6  Total Bilirubin 0.3 - 1.2 mg/dL 0.4  Alkaline Phos 38 - 126 U/L 123  AST 15 - 41 U/L 18  ALT 0 - 44 U/L 13   Edinburgh Score: Edinburgh Postnatal Depression Scale Screening Tool 07/25/2021  I have been able to laugh and see the funny side of things. 0  I have looked forward with enjoyment to things. 0  I have blamed myself unnecessarily when things went wrong. 1  I have been anxious or worried for no good reason. 0  I have felt scared or panicky for no good reason. 0  Things have been getting on top of me. 0  I have been so unhappy that I have had difficulty sleeping. 0  I have felt sad or miserable. 0  I have been so unhappy that I have been crying. 0  The thought of harming myself has occurred to me. 0  Edinburgh Postnatal Depression Scale Total 1      After visit meds:  Allergies as of 07/26/2021  No Known Allergies      Medication List     STOP taking these medications    aspirin EC 81 MG tablet       TAKE these medications    docusate sodium 100 MG capsule Commonly known as: COLACE Take 200 mg by mouth 2 (two) times daily.   hydrocortisone-pramoxine 2.5-1 % rectal cream Commonly known as: ANALPRAM-HC Place 1 application rectally as needed for hemorrhoids.   ibuprofen 600 MG tablet Commonly known as: ADVIL Take 1 tablet (600 mg total) by mouth every 6 (six) hours as needed for moderate pain.   NIFEdipine 30 MG 24 hr tablet Commonly known as: Procardia XL Take 1 tablet (30 mg total) by mouth daily. What changed: when to take this   omeprazole 20 MG capsule Commonly known as: PRILOSEC Take 20 mg by mouth daily.   ondansetron 4 MG tablet Commonly known as: ZOFRAN Take 1 tablet by mouth as needed for nausea/vomiting.   polyethylene glycol 17 g packet Commonly known as: MIRALAX / GLYCOLAX Take 17 g by mouth every other day.   PRENATAL AD  PO Take 1 tablet by mouth daily. Unknown strenght   promethazine 25 MG tablet Commonly known as: PHENERGAN Take 1 tablet by mouth as needed for nausea/vomiting.         Discharge home in stable condition Infant Feeding: Breast Infant Disposition:home with mother Discharge instruction: per After Visit Summary and Postpartum booklet. Activity: Advance as tolerated. Pelvic rest for 6 weeks.  Diet: routine diet Postpartum Appointment: 3-4 days for BP check Future Appointments: Future Appointments  Date Time Provider Department Center  08/03/2021  9:40 AM Tobb, Lavona [place]ound, DO [facility]   Follow up Visit:  Follow-up Information     Carrington Clamp, MD. Schedule an appointment as soon as possible for a visit.   Specialty: Obstetrics and Gynecology Why: to check BP Contact information: [address] [place] [address] [phone]                     07/26/2021 Zenaida Niece, MD

## 2021-07-26 NOTE — Lactation Note (Signed)
This note was copied from a baby's chart. Lactation Consultation Note  Patient Name: Alicia Nguyen [id number] Date: 07/26/2021 Reason for consult: Initial assessment Age46 Hours P2 mother , infant is at 9% weight loss this am.  Advised mother to page for [facility] when infant is ready for the next feeding.  Mother paged. She reports that infant has breastfed for 5 mins before LC arrived.  Observed mother attempting to latch infant on the Rt breast in cross cradle hold. Mothers RT nipple is biforcated.   Advised to place infant in football hold.  Infant latched well with good depth. Observed rhythmic pattern of suckling and audible swallows. Infant sustained latch for 15 mins.  Mothers breast are filling.  Suggested that mother hand express and supplement infant with any amt of ebm using method of choice. Mother was given a curved tip syringe and a foley cup with instructions.  Discussed continued cue base feeding, cluster feeding and advised to breastfeed infant at least 8-12 or more times in 24 hours. Discussed tr and prevention of engorgement.  Message sent to OP DEPT for follow up visit with LC   Maternal Data    Feeding Mother's Current Feeding Choice: Breast Milk  LATCH Score Latch: Repeated attempts needed to sustain latch, nipple held in mouth throughout feeding, stimulation needed to elicit sucking reflex.  Audible Swallowing: Spontaneous and intermittent  Type of Nipple: Everted at rest and after stimulation  Comfort (Breast/Nipple): Filling, red/small blisters or bruises, mild/mod discomfort  Hold (Positioning): No assistance needed to correctly position infant at breast.  LATCH Score: 8   Lactation Tools Discussed/Used    Interventions Interventions: Skin to skin;Assisted with latch;Hand express;Adjust position;Support pillows;Breast compression;Position options  Discharge Discharge Education: Engorgement and breast care;Warning signs for feeding baby;Outpatient  recommendation;Outpatient Epic message sent Pump: Manual;Personal  Consult Status Consult Status: Complete    Michel Bickers 07/26/2021, 11:21 AM

## 2021-07-26 NOTE — Progress Notes (Signed)
PPD #2 Feeling ok Afeb, VSS, BP 108-130/78-90 D/s home on Procardia

## 2021-07-26 NOTE — Discharge Instructions (Signed)
As per discharge pamphlet °

## 2021-07-28 ENCOUNTER — Telehealth: Payer: Self-pay

## 2021-07-28 DIAGNOSIS — R102 Pelvic and perineal pain: Secondary | ICD-10-CM | POA: Diagnosis not present

## 2021-07-28 NOTE — Telephone Encounter (Signed)
Called pt to check on her after giving birth. No answer at this time, I will send her  MyChart message.

## 2021-08-03 ENCOUNTER — Telehealth (INDEPENDENT_AMBULATORY_CARE_PROVIDER_SITE_OTHER): Payer: BC Managed Care – PPO | Admitting: Cardiology

## 2021-08-03 ENCOUNTER — Encounter: Payer: Self-pay | Admitting: Cardiology

## 2021-08-03 ENCOUNTER — Telehealth (HOSPITAL_COMMUNITY): Payer: Self-pay | Admitting: *Deleted

## 2021-08-03 VITALS — BP 115/83 | Ht 68.0 in | Wt 179.0 lb

## 2021-08-03 DIAGNOSIS — Z331 Pregnant state, incidental: Secondary | ICD-10-CM

## 2021-08-03 DIAGNOSIS — O133 Gestational [pregnancy-induced] hypertension without significant proteinuria, third trimester: Secondary | ICD-10-CM

## 2021-08-03 NOTE — Patient Instructions (Addendum)
Medication Instructions:  Your physician recommends that you continue on your current medications as directed. Please refer to the Current Medication list given to you today.  *If you need a refill on your cardiac medications before your next appointment, please call your pharmacy*   Lab Work: None If you have labs (blood work) drawn today and your tests are completely normal, you will receive your results only by: MyChart Message (if you have MyChart) OR A paper copy in the mail If you have any lab test that is abnormal or we need to change your treatment, we will call you to review the results.   Testing/Procedures: None   Follow-Up: At [facility], you and your health needs are our priority.  As part of our continuing mission to provide you with exceptional heart care, we have created designated Provider Care Teams.  These Care Teams include your primary Cardiologist (physician) and Advanced Practice Providers (APPs -  Physician Assistants and Nurse Practitioners) who all work together to provide you with the care you need, when you need it.  We recommend signing up for the patient portal called "MyChart".  Sign up information is provided on this After Visit Summary.  MyChart is used to connect with patients for Virtual Visits (Telemedicine).  Patients are able to view lab/test results, encounter notes, upcoming appointments, etc.  Non-urgent messages can be sent to your provider as well.   To learn more about what you can do with MyChart, go to [email or web address].    Your next appointment:   12 week(s)  The format for your next appointment:   In Person  Provider:   Thomasene Ripple  [facility] [address], [place], [place] [address]

## 2021-08-03 NOTE — Telephone Encounter (Signed)
Attempted Hospital Discharge Follow-Up Call.  Left voice mail requesting that patient return RN's phone call.  

## 2021-08-03 NOTE — Progress Notes (Addendum)
Cardio-Obstetrics Clinic  Follow Up Note   Date:  08/03/2021   ID:  Alicia Nguyen, DOB October 08, 1986, MRN [id number]  PCP:  Pcp, No   [facility] Providers Cardiologist:  None  Electrophysiologist:  None        Referring MD: No ref. provider found   I am doing well   The patient is at home. I am in the office.  Virtual Visit via Video  Note . I connected with the patient today by a  video enabled telemedicine application and verified that I am speaking with the correct person using two identifiers.  History of Present Illness:    Alicia Nguyen is a 35 y.o. female [G2P2001] who returns for follow up of gestational hypertension.  She has a history of pre-eclampsia during her last pregnancy (delivered in August 2021). Also with a history of HTN, for which she took antihypertensives from 2014-2018, but this reportedly resolved after she lost a significant amount of weight.   At her initial cardio-ob visit on 03/15/21 her BP was 110/72.  Therefore no medication changes were made.   At her visit on June 05, 2019 she told me that she had been experiencing elevated blood pressure.  And this was concerning.  During that visit her blood pressure was elevated I therefore started the patient on nifedipine 30 mg daily.  In the interim her blood pressure still remain elevated so her OB/GYN increase her nifedipine to 60 mg daily.  She had lightheadedness with the full dose.  Since I saw the patient she has delivered and is doing well postpartum.  She tells me that her blood pressure is averaging with the systolics between 115 to 120 mmHg.  She is currently on the nifedipine 30 mg daily.  Prior CV Studies Reviewed: The following studies were reviewed today:  [place] of lower extremities 02/15/2021 Examination Guidelines:  A complete evaluation includes B-mode imaging, spectral Doppler, color  Doppler,  and power Doppler as needed of all accessible portions of each vessel.  Bilateral  testing is  considered an integral part of a complete examination. Limited  examinations for reoccurring indications may be performed as noted. The  reflux  portion of the exam is performed with the patient in reverse  Trendelenburg.     Past Medical History:  Diagnosis Date   Hypertension    Preeclampsia    Pregnancy induced hypertension    Pregnant     Past Surgical History:  Procedure Laterality Date   FOOT FRACTURE SURGERY     Optic gioma removal     REFRACTIVE SURGERY        OB History     Gravida  2   Para  2   Term  2   Preterm  0   AB  0   Living  1      SAB  0   IAB  0   Ectopic  0   Multiple  0   Live Births  1               Current Medications: Current Meds  Medication Sig   docusate sodium (COLACE) 100 MG capsule Take 200 mg by mouth 2 (two) times daily.   ibuprofen (ADVIL) 600 MG tablet Take 1 tablet (600 mg total) by mouth every 6 (six) hours as needed for moderate pain.   NIFEdipine (PROCARDIA XL) 30 MG 24 hr tablet Take 1 tablet (30 mg total) by mouth daily.   polyethylene glycol (MIRALAX /  GLYCOLAX) 17 g packet Take 17 g by mouth every other day.   Prenatal Vit-DSS-Fe Cbn-FA (PRENATAL AD PO) Take 1 tablet by mouth daily. Unknown strenght     Allergies:   Patient has no known allergies.   Social History   Socioeconomic History   Marital status: Married    Spouse name: Not on file   Number of children: Not on file   Years of education: Not on file   Highest education level: Not on file  Occupational History   Not on file  Tobacco Use   Smoking status: Never   Smokeless tobacco: Never  Vaping Use   Vaping Use: Never used  Substance and Sexual Activity   Alcohol use: Not Currently   Drug use: Never   Sexual activity: Not Currently  Other Topics Concern   Not on file  Social History Narrative   ** Merged History Encounter **       Social Determinants of Health   Financial Resource Strain: Not on file  Food Insecurity:  Unknown   Worried About Programme researcher, broadcasting/film/video in the Last Year: Never true   Ran Out of Food in the Last Year: Not on file  Transportation Needs: Unknown   Lack of Transportation (Medical): No   Lack of Transportation (Non-Medical): Not on file  Physical Activity: Not on file  Stress: Not on file  Social Connections: Not on file      Family History  Problem Relation Age of Onset   Colon polyps Father    Hypertension Father    Diabetes Maternal Grandmother    Skin cancer Maternal Grandmother    CVA Maternal Grandmother    Cervical cancer Maternal Aunt    Dementia Maternal Aunt    Colon cancer Neg Hx    Liver disease Neg Hx    Esophageal cancer Neg Hx    Pancreatic cancer Neg Hx    Stomach cancer Neg Hx       ROS:   Please see the history of present illness.     All other systems reviewed and are negative.   Labs/EKG Reviewed:    EKG:   EKG is was not  ordered today.   Recent Labs: 07/17/2021: ALT 13; BUN 9; Creatinine, Ser 0.77; Potassium 3.7; Sodium 138 07/25/2021: Hemoglobin 9.1; Platelets 242   Recent Lipid Panel No results found for: CHOL, TRIG, HDL, CHOLHDL, LDLCALC, LDLDIRECT  Physical Exam:    VS:  BP 115/83 (BP Location: Left Arm, Patient Position: Sitting, Cuff Size: Normal)   Ht 5\' 8"  (1.727 m)   Wt 179 lb (81.2 kg) Comment: 07/29/2021 last weighed at obgyn appt  LMP 10/25/2020   BMI 27.22 kg/m     Wt Readings from Last 3 Encounters:  08/03/21 179 lb (81.2 kg)  07/24/21 197 lb (89.4 kg)  07/23/21 194 lb 11.2 oz (88.3 kg)   No physical exam this was a virtual visit   Risk Assessment/Risk Calculators:                 ASSESSMENT & PLAN:    Gestational hypertension  She successfully delivered her baby.  Mom and baby are both doing well.  Her blood pressure has been steady below target of less than 130/80 mmHg.  For now I would recommend to keep the patient on her current dose of nifedipine 30 mg daily.  I will be very cautious about just  stopping her antihypertensive in the next 12 weeks to avoid any rebound  in this very crucial period.  She will follow-up with me in 12 weeks and if her blood pressure is now steady at that time we will discontinue her antihypertensives with close instructions.  The patient is in agreement with the above plan. The patient left the office in stable condition.  The patient will follow up in 12 weeks.  There are no Patient Instructions on file for this visit.   Dispo:  No follow-ups on file.   Medication Adjustments/Labs and Tests Ordered: Current medicines are reviewed at length with the patient today.  Concerns regarding medicines are outlined above.  Tests Ordered: No orders of the defined types were placed in this encounter.  Medication Changes: No orders of the defined types were placed in this encounter.    CMedication Changes: No orders of the defined types were placed in this encounter.

## 2021-10-15 ENCOUNTER — Ambulatory Visit (INDEPENDENT_AMBULATORY_CARE_PROVIDER_SITE_OTHER): Payer: BC Managed Care – PPO | Admitting: Cardiology

## 2021-10-15 ENCOUNTER — Other Ambulatory Visit: Payer: Self-pay

## 2021-10-15 ENCOUNTER — Encounter: Payer: Self-pay | Admitting: Cardiology

## 2021-10-15 VITALS — BP 130/86 | HR 78 | Ht 68.0 in | Wt 175.8 lb

## 2021-10-15 DIAGNOSIS — O133 Gestational [pregnancy-induced] hypertension without significant proteinuria, third trimester: Secondary | ICD-10-CM | POA: Diagnosis not present

## 2021-10-15 DIAGNOSIS — Z79899 Other long term (current) drug therapy: Secondary | ICD-10-CM

## 2021-10-15 HISTORY — DX: Other long term (current) drug therapy: Z79.899

## 2021-10-15 MED ORDER — VALSARTAN 80 MG PO TABS: 80.0000 mg | ORAL_TABLET | Freq: Every day | ORAL | 3 refills | Status: DC

## 2021-10-15 NOTE — Progress Notes (Signed)
Cardio-Obstetrics Clinic  Follow Up Note   Date:  10/15/2021   ID:  Alicia Nguyen, DOB 1986/09/22, MRN [id number]  PCP:  Pcp, No   [facility] Providers Cardiologist:  Thomasene Ripple, DO  Electrophysiologist:  None        Referring MD: No ref. provider found   Chief Complaint: " My blood pressure still not well controlled"  History of Present Illness:    Alicia Nguyen is a 35 y.o. female [G2P2001] who returns for follow up of for a follow up postpartum hypertension visit.  She has a history of pre-eclampsia during her last pregnancy (delivered in August 2021). Also with a history of HTN, for which she took antihypertensives from 2014-2018, but this reportedly resolved after she lost a significant amount of weight.    At her initial cardio-ob visit on 03/15/21 her BP was 110/72.  Therefore no medication changes were made.   At her visit on June 05, 2019 she told me that she had been experiencing elevated blood pressure.  And this was concerning.  During that visit her blood pressure was elevated I therefore started the patient on nifedipine 30 mg daily.  In the interim her blood pressure still remain elevated so her OB/GYN increase her nifedipine to 60 mg daily.  She had lightheadedness with the full dose.  I saw the patient on August 03, 2021 at that time she had delivered and was postpartum.  During that visit her blood pressure was averaging in the 150s to 120.  She was on nifedipine 30 mg daily.  I did not change her medication.  She was doing well on the current dose of antihypertensive medication.  Today she tells me that the nifedipine 30 is not helping.  Her blood pressure now is averaging higher in the 130s and she is concerned.  No other complaints at this time.  Prior CV Studies Reviewed: The following studies were reviewed today:   TTE IMPRESSIONS 02/03/2021  1. Left ventricular ejection fraction, by estimation, is 60 to 65%. The  left ventricle has normal function. The  left ventricle has no regional  wall motion abnormalities. Left ventricular diastolic parameters were  normal.   2. Right ventricular systolic function is normal. The right ventricular  size is normal.   3. The mitral valve is normal in structure. No evidence of mitral valve  regurgitation. No evidence of mitral stenosis.   4. The aortic valve is normal in structure. Aortic valve regurgitation is  not visualized. No aortic stenosis is present.   5. The inferior vena cava is normal in size with greater than 50%  respiratory variability, suggesting right atrial pressure of 3 mmHg.   FINDINGS   Left Ventricle: Left ventricular ejection fraction, by estimation, is 60  to 65%. The left ventricle has normal function. The left ventricle has no  regional wall motion abnormalities. The left ventricular internal cavity  size was normal in size. There is   no left ventricular hypertrophy. Left ventricular diastolic parameters  were normal.   Right Ventricle: The right ventricular size is normal. No increase in  right ventricular wall thickness. Right ventricular systolic function is  normal.   Left Atrium: Left atrial size was normal in size.   Right Atrium: Right atrial size was normal in size.   Pericardium: There is no evidence of pericardial effusion.   Mitral Valve: The mitral valve is normal in structure. No evidence of  mitral valve regurgitation. No evidence of mitral valve stenosis.  Tricuspid Valve: The tricuspid valve is normal in structure. Tricuspid  valve regurgitation is not demonstrated. No evidence of tricuspid  stenosis.   Aortic Valve: The aortic valve is normal in structure. Aortic valve  regurgitation is not visualized. No aortic stenosis is present.   Pulmonic Valve: The pulmonic valve was normal in structure. Pulmonic valve  regurgitation is not visualized. No evidence of pulmonic stenosis.   Aorta: The aortic root is normal in size and structure.   Venous:  The inferior vena cava is normal in size with greater than 50%  respiratory variability, suggesting right atrial pressure of 3 mmHg.   IAS/Shunts: No atrial level shunt detected by color flow Doppler.      [place] of lower extremities 02/15/2021 Examination Guidelines:  A complete evaluation includes B-mode imaging, spectral Doppler, color  Doppler,  and power Doppler as needed of all accessible portions of each vessel.  Bilateral  testing is considered an integral part of a complete examination. Limited  examinations for reoccurring indications may be performed as noted. The  reflux  portion of the exam is performed with the patient in reverse  Trendelenburg.    Past Medical History:  Diagnosis Date   Hypertension    Preeclampsia    Pregnancy induced hypertension    Pregnant     Past Surgical History:  Procedure Laterality Date   FOOT FRACTURE SURGERY     Optic gioma removal     REFRACTIVE SURGERY        OB History     Gravida  2   Para  2   Term  2   Preterm  0   AB  0   Living  1      SAB  0   IAB  0   Ectopic  0   Multiple  0   Live Births  1               Current Medications: Current Meds  Medication Sig   TRI-ESTARYLLA 0.18/0.215/0.25 MG-35 MCG tablet Take 1 tablet by mouth daily.   valsartan (DIOVAN) 80 MG tablet Take 1 tablet (80 mg total) by mouth daily.   [DISCONTINUED] NIFEdipine (PROCARDIA XL) 30 MG 24 hr tablet Take 1 tablet (30 mg total) by mouth daily.     Allergies:   Patient has no known allergies.   Social History   Socioeconomic History   Marital status: Married    Spouse name: Not on file   Number of children: Not on file   Years of education: Not on file   Highest education level: Not on file  Occupational History   Not on file  Tobacco Use   Smoking status: Never   Smokeless tobacco: Never  Vaping Use   Vaping Use: Never used  Substance and Sexual Activity   Alcohol use: Not Currently   Drug use: Never    Sexual activity: Not Currently  Other Topics Concern   Not on file  Social History Narrative   ** Merged History Encounter **       Social Determinants of Health   Financial Resource Strain: Not on file  Food Insecurity: Unknown   Worried About Programme researcher, broadcasting/film/video in the Last Year: Never true   Ran Out of Food in the Last Year: Not on file  Transportation Needs: Unknown   Lack of Transportation (Medical): No   Lack of Transportation (Non-Medical): Not on file  Physical Activity: Not on file  Stress: Not on  file  Social Connections: Not on file      Family History  Problem Relation Age of Onset   Colon polyps Father    Hypertension Father    Diabetes Maternal Grandmother    Skin cancer Maternal Grandmother    CVA Maternal Grandmother    Cervical cancer Maternal Aunt    Dementia Maternal Aunt    Colon cancer Neg Hx    Liver disease Neg Hx    Esophageal cancer Neg Hx    Pancreatic cancer Neg Hx    Stomach cancer Neg Hx       ROS:   Review of Systems  Constitution: Negative for decreased appetite, fever and weight gain.  HENT: Negative for congestion, ear discharge, hoarse voice and sore throat.   Eyes: Negative for discharge, redness, vision loss in right eye and visual halos.  Cardiovascular: Negative for chest pain, dyspnea on exertion, leg swelling, orthopnea and palpitations.  Respiratory: Negative for cough, hemoptysis, shortness of breath and snoring.   Endocrine: Negative for heat intolerance and polyphagia.  Hematologic/Lymphatic: Negative for bleeding problem. Does not bruise/bleed easily.  Skin: Negative for flushing, nail changes, rash and suspicious lesions.  Musculoskeletal: Negative for arthritis, joint pain, muscle cramps, myalgias, neck pain and stiffness.  Gastrointestinal: Negative for abdominal pain, bowel incontinence, diarrhea and excessive appetite.  Genitourinary: Negative for decreased libido, genital sores and incomplete emptying.   Neurological: Negative for brief paralysis, focal weakness, headaches and loss of balance.  Psychiatric/Behavioral: Negative for altered mental status, depression and suicidal ideas.  Allergic/Immunologic: Negative for HIV exposure and persistent infections.   Labs/EKG Reviewed:    EKG:   EKG is was not ordered today.    Recent Labs: 07/17/2021: ALT 13; BUN 9; Creatinine, Ser 0.77; Potassium 3.7; Sodium 138 07/25/2021: Hemoglobin 9.1; Platelets 242   Recent Lipid Panel No results found for: CHOL, TRIG, HDL, CHOLHDL, LDLCALC, LDLDIRECT  Physical Exam:    VS:  BP 130/86 (BP Location: Left Arm)    Pulse 78    Ht  (1.727 m)    Wt 175 lb 12.8 oz (79.7 kg)    LMP 10/25/2020    SpO2 97%    BMI 26.73 kg/m     Wt Readings from Last 3 Encounters:  10/15/21 175 lb 12.8 oz (79.7 kg)  08/03/21 179 lb (81.2 kg)  07/24/21 197 lb (89.4 kg)     GEN:  Well nourished, well developed in no acute distress HEENT: Normal NECK: No JVD; No carotid bruits LYMPHATICS: No lymphadenopathy CARDIAC: RRR, no murmurs, rubs, gallops RESPIRATORY:  Clear to auscultation without rales, wheezing or rhonchi  ABDOMEN: Soft, non-tender, non-distended MUSCULOSKELETAL:  No edema; No deformity  SKIN: Warm and dry NEUROLOGIC:  Alert and oriented x 3 PSYCHIATRIC:  Normal affect    Risk Assessment/Risk Calculators:                 ASSESSMENT & PLAN:    Gestational hypertension  She still is hypertensive despite the nifedipine 30 mg daily.  Thankfully she tells me she is not breast-feeding so her options for antihypertensive medication is broad.  Recommend stopping the therapy and start the patient on valsartan 80 mg daily.  We have room to go up on the antihypertensive medication if needed.  We will plan to keep her less than 130/80 mmHg.  Follow-up in 12 weeks due to medication change.  Patient Instructions  Medication Instructions:  STOP Nifedipine (Procardia XL) START Valsartan 80 mg  daily  *If  you need a refill on your cardiac medications before your next appointment, please call your pharmacy*  Lab Work: Your physician recommends that you return for lab work in 4 weeks (11/12/21):  BMET  Magnesium  If you have labs (blood work) drawn today and your tests are completely normal, you will receive your results only by: MyChart Message (if you have MyChart) OR A paper copy in the mail If you have any lab test that is abnormal or we need to change your treatment, we will call you to review the results.  Testing/Procedures: NONE ordered at this time of appointment  Follow-Up: At [facility], you and your health needs are our priority.  As part of our continuing mission to provide you with exceptional heart care, we have created designated Provider Care Teams.  These Care Teams include your primary Cardiologist (physician) and Advanced Practice Providers (APPs -  Physician Assistants and Nurse Practitioners) who all work together to provide you with the care you need, when you need it.  We recommend signing up for the patient portal called "MyChart".  Sign up information is provided on this After Visit Summary.  MyChart is used to connect with patients for Virtual Visits (Telemedicine).  Patients are able to view lab/test results, encounter notes, upcoming appointments, etc.  Non-urgent messages can be sent to your provider as well.   To learn more about what you can do with MyChart, go to [email or web address].    Your next appointment:   12 week(s)  The format for your next appointment:   In Person  Provider:   Thomasene Ripple, DO   [address] [place], [place] [address]  Other Instructions   Dispo:  Return in about 12 weeks (around 01/07/2022).   Medication Adjustments/Labs and Tests Ordered: Current medicines are reviewed at length with the patient today.  Concerns regarding medicines are outlined above.  Tests Ordered: Orders Placed This Encounter   Procedures   Basic metabolic panel   Magnesium   Medication Changes: Meds ordered this encounter  Medications   valsartan (DIOVAN) 80 MG tablet    Sig: Take 1 tablet (80 mg total) by mouth daily.    Dispense:  90 tablet    Refill:  3

## 2021-10-15 NOTE — Patient Instructions (Addendum)
Medication Instructions:  STOP Nifedipine (Procardia XL) START Valsartan 80 mg daily  *If you need a refill on your cardiac medications before your next appointment, please call your pharmacy*  Lab Work: Your physician recommends that you return for lab work in 4 weeks (11/12/21):  BMET  Magnesium  If you have labs (blood work) drawn today and your tests are completely normal, you will receive your results only by: MyChart Message (if you have MyChart) OR A paper copy in the mail If you have any lab test that is abnormal or we need to change your treatment, we will call you to review the results.  Testing/Procedures: NONE ordered at this time of appointment  Follow-Up: At [facility], you and your health needs are our priority.  As part of our continuing mission to provide you with exceptional heart care, we have created designated Provider Care Teams.  These Care Teams include your primary Cardiologist (physician) and Advanced Practice Providers (APPs -  Physician Assistants and Nurse Practitioners) who all work together to provide you with the care you need, when you need it.  We recommend signing up for the patient portal called "MyChart".  Sign up information is provided on this After Visit Summary.  MyChart is used to connect with patients for Virtual Visits (Telemedicine).  Patients are able to view lab/test results, encounter notes, upcoming appointments, etc.  Non-urgent messages can be sent to your provider as well.   To learn more about what you can do with MyChart, go to [email or web address].    Your next appointment:   12 week(s)  The format for your next appointment:   In Person  Provider:   Thomasene Ripple, DO   [address] [place], [place] [address]  Other Instructions

## 2021-11-08 ENCOUNTER — Other Ambulatory Visit: Payer: Self-pay

## 2021-11-08 ENCOUNTER — Encounter: Payer: Self-pay | Admitting: Cardiology

## 2021-11-08 MED ORDER — METOPROLOL TARTRATE 25 MG PO TABS: 25.0000 mg | ORAL_TABLET | Freq: Two times a day (BID) | ORAL | 0 refills | Status: DC

## 2021-11-14 DIAGNOSIS — F32A Depression, unspecified: Secondary | ICD-10-CM

## 2021-11-14 HISTORY — DX: Depression, unspecified: F32.A

## 2021-12-06 ENCOUNTER — Other Ambulatory Visit: Payer: Self-pay | Admitting: Cardiology

## 2021-12-08 ENCOUNTER — Other Ambulatory Visit: Payer: Self-pay

## 2021-12-08 MED ORDER — METOPROLOL TARTRATE 25 MG PO TABS: ORAL_TABLET | ORAL | 0 refills | Status: DC

## 2021-12-08 NOTE — Progress Notes (Signed)
Refill sent to pharmacy.   

## 2021-12-15 LAB — BASIC METABOLIC PANEL
BUN/Creatinine Ratio: 12 (ref 9–23)
BUN: 11 mg/dL (ref 6–20)
CO2: 23 mmol/L (ref 20–29)
Calcium: 8.6 mg/dL — ABNORMAL LOW (ref 8.7–10.2)
Chloride: 108 mmol/L — ABNORMAL HIGH (ref 96–106)
Creatinine, Ser: 0.89 mg/dL (ref 0.57–1.00)
Glucose: 84 mg/dL (ref 70–99)
Potassium: 4.6 mmol/L (ref 3.5–5.2)
Sodium: 142 mmol/L (ref 134–144)
eGFR: 86 mL/min/{1.73_m2} (ref >59)

## 2021-12-15 LAB — MAGNESIUM: Magnesium: 2 mg/dL (ref 1.6–2.3)

## 2021-12-19 DIAGNOSIS — H00025 Hordeolum internum left lower eyelid: Secondary | ICD-10-CM | POA: Diagnosis not present

## 2021-12-21 ENCOUNTER — Other Ambulatory Visit: Payer: Self-pay

## 2021-12-21 DIAGNOSIS — F32A Depression, unspecified: Secondary | ICD-10-CM | POA: Diagnosis not present

## 2021-12-21 DIAGNOSIS — Z6826 Body mass index (BMI) 26.0-26.9, adult: Secondary | ICD-10-CM | POA: Diagnosis not present

## 2021-12-21 MED ORDER — HYDROCHLOROTHIAZIDE 12.5 MG PO CAPS: 12.5000 mg | ORAL_CAPSULE | Freq: Every day | ORAL | 3 refills | Status: DC

## 2021-12-28 DIAGNOSIS — H1045 Other chronic allergic conjunctivitis: Secondary | ICD-10-CM | POA: Diagnosis not present

## 2021-12-28 DIAGNOSIS — H0288A Meibomian gland dysfunction right eye, upper and lower eyelids: Secondary | ICD-10-CM | POA: Diagnosis not present

## 2021-12-28 DIAGNOSIS — H0288B Meibomian gland dysfunction left eye, upper and lower eyelids: Secondary | ICD-10-CM | POA: Diagnosis not present

## 2021-12-29 ENCOUNTER — Ambulatory Visit (INDEPENDENT_AMBULATORY_CARE_PROVIDER_SITE_OTHER): Payer: BC Managed Care – PPO | Admitting: Registered Nurse

## 2021-12-29 ENCOUNTER — Other Ambulatory Visit: Payer: Self-pay

## 2021-12-29 ENCOUNTER — Encounter: Payer: Self-pay | Admitting: Registered Nurse

## 2021-12-29 VITALS — BP 126/89 | HR 86 | Temp 97.9°F | Resp 18 | Ht 68.0 in | Wt 172.0 lb

## 2021-12-29 DIAGNOSIS — F411 Generalized anxiety disorder: Secondary | ICD-10-CM | POA: Diagnosis not present

## 2021-12-29 DIAGNOSIS — M26622 Arthralgia of left temporomandibular joint: Secondary | ICD-10-CM | POA: Diagnosis not present

## 2021-12-29 DIAGNOSIS — F53 Postpartum depression: Secondary | ICD-10-CM | POA: Diagnosis not present

## 2021-12-29 MED ORDER — CYCLOBENZAPRINE HCL 5 MG PO TABS: 5.0000 mg | ORAL_TABLET | Freq: Three times a day (TID) | ORAL | 1 refills | Status: DC | PRN

## 2021-12-29 NOTE — Patient Instructions (Addendum)
Ms. Alicia Nguyen -  ? ?Great to see you.  ? ?Continue zoloft. Continue ambien. ?Can consider dose changes or other meds (esp for sleep) if needed. ? ?Can use flexeril 5mg  three times daily as needed for jaw pain. Works well with ibuprofen and tylenol. ? ?I will refer to counseling - they will call you to schedule ? ?Thank you, ? ?Rich  ? ? ? ?If you have lab work done today you will be contacted with your lab results within the next 2 weeks.  If you have not heard from then please contact us. The fastest way to get your results is to register for My Chart. ? ? ?IF you received an x-ray today, you will receive an invoice from [facility]. Please contact [facility] at [phone] with questions or concerns regarding your invoice.  ? ?IF you received labwork today, you will receive an invoice from [facility]. Please contact [facility] at [phone] with questions or concerns regarding your invoice.  ? ?Our billing staff will not be able to assist you with questions regarding bills from these companies. ? ?You will be contacted with the lab results as soon as they are available. The fastest way to get your results is to activate your My Chart account. Instructions are located on the last page of this paperwork. If you have not heard from [phone] regarding the results in 2 weeks, please contact this office. ?  ? ? ?

## 2021-12-29 NOTE — Progress Notes (Signed)
? ?New Patient Office Visit ? ?Subjective:  ?Patient ID: Alicia Nguyen, female    DOB: [birth year]  Age: 36 y.o. MRN: [id number] ? ?CC:  ?Chief Complaint  ?Patient presents with  ? New Patient (Initial Visit)  ?  Patient is here to establish care. Patient wants to discuss depression and anxiety PHQ9=8 GAD7=17  ? ? ?HPI ?Alicia Nguyen presents for visit to est care. ? ?Notes she has 2 kids in the past 2 years. 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 Will look to transition care to our office after following with OB. Doing well for now on these medications, may want to reassess sleep needs in future. ? ?She notes TMJ pain, L. Ongoing. Stress has her grinding her teeth frequently.  ?She is interested in options. Acknowledges that control of anxiety will likely improve these symptoms.  ? ?She is interested in counseling. Would like referral. ? ?Past Medical History:  ?Diagnosis Date  ? Anxiety 2010  ? Depression 11/14/2021  ? Post partum depression  ? Hypertension   ? Preeclampsia   ? Pregnancy induced hypertension   ? Pregnant   ? ? ?Past Surgical History:  ?Procedure Laterality Date  ? BRAIN SURGERY  1989  ? Optic glioma removal  ? EYE SURGERY    ? Strabismus right eye  ? FOOT FRACTURE SURGERY    ? Optic gioma removal    ? REFRACTIVE SURGERY    ? ? ?Family History  ?Problem Relation Age of Onset  ? Diabetes Mother   ? Hypertension Mother   ? Miscarriages / [place] Mother   ? Skin cancer Father   ? Colon polyps Father   ? Hypertension Father   ? Diabetes Maternal Grandfather   ? Skin cancer Maternal Grandfather   ? Diabetes Paternal Grandmother   ? CVA Paternal Grandmother   ? Dementia Maternal Aunt   ? Cervical cancer Paternal Aunt   ? Colon cancer Neg Hx   ? Liver disease Neg Hx   ? Esophageal cancer Neg Hx   ? Stomach cancer Neg Hx   ? ? ?Social History  ? ?Socioeconomic History  ? Marital status: Married  ?  Spouse name: Not on file  ? Number of children: 2  ?  Years of education: Not on file  ? Highest education level: Not on file  ?Occupational History  ? Not on file  ?Tobacco Use  ? Smoking status: Never  ? Smokeless tobacco: Never  ?Vaping Use  ? Vaping Use: Never used  ?Substance and Sexual Activity  ? Alcohol use: Not Currently  ? Drug use: Never  ? Sexual activity: Yes  ?  Birth control/protection: Pill  ?Other Topics Concern  ? Not on file  ?Social History Narrative  ? Not on file  ? ?Social Determinants of Health  ? ?Financial Resource Strain: Not on file  ?Food Insecurity: Unknown  ? Worried About Programme researcher, broadcasting/film/video in the Last Year: Never true  ? Ran Out of Food in the Last Year: Not on file  ?Transportation Needs: Unknown  ? Lack of Transportation (Medical): No  ? Lack of Transportation (Non-Medical): Not on file  ?Physical Activity: Not on file  ?Stress: Not on file  ?Social Connections: Not on file  ?Intimate Partner Violence: Not on file  ? ? ?ROS ?Review of Systems  ?Constitutional: Negative.   ?HENT: Negative.    ?Eyes: Negative.   ?Respiratory: Negative.    ?  Cardiovascular: Negative.   ?Gastrointestinal: Negative.   ?Genitourinary: Negative.   ?Musculoskeletal: Negative.   ?Skin: Negative.   ?Neurological: Negative.   ?Psychiatric/Behavioral: Negative.    ?All other systems reviewed and are negative. ? ?Objective:  ? ?Today's Vitals: BP 126/89   Pulse 86   Temp 97.9 ?F (36.6 ?C) (Temporal)   Resp 18   Ht 5\' 8"  (1.727 m)   Wt 172 lb (78 kg)   SpO2 100%   BMI 26.15 kg/m?  ? ?Physical Exam ?Vitals and nursing note reviewed.  ?Constitutional:   ?   General: She is not in acute distress. ?   Appearance: Normal appearance. She is not ill-appearing, toxic-appearing or diaphoretic.  ?Cardiovascular:  ?   Rate and Rhythm: Normal rate and regular rhythm.  ?   Pulses: Normal pulses.  ?   Heart sounds: Normal heart sounds. No murmur heard. ?  No friction rub. No gallop.  ?Pulmonary:  ?   Effort: Pulmonary effort is normal. No respiratory distress.  ?   Breath  sounds: Normal breath sounds. No stridor. No wheezing, rhonchi or rales.  ?Chest:  ?   Chest wall: No tenderness.  ?Skin: ?   General: Skin is warm and dry.  ?   Capillary Refill: Capillary refill takes less than 2 seconds.  ?Neurological:  ?   General: No focal deficit present.  ?   Mental Status: She is alert and oriented to person, place, and time. Mental status is at baseline.  ?Psychiatric:     ?   Mood and Affect: Mood normal.     ?   Behavior: Behavior normal.     ?   Thought Content: Thought content normal.     ?   Judgment: Judgment normal.  ? ? ?Assessment & Plan:  ? ?Problem List Items Addressed This Visit   ?None ?Visit Diagnoses   ? ? Arthralgia of left temporomandibular joint    -  Primary  ? Relevant Medications  ? cyclobenzaprine (FLEXERIL) 5 MG tablet  ? GAD (generalized anxiety disorder)      ? Relevant Medications  ? sertraline (ZOLOFT) 50 MG tablet  ? Other Relevant Orders  ? Ambulatory referral to Psychology  ? Post partum depression      ? Relevant Medications  ? sertraline (ZOLOFT) 50 MG tablet  ? Other Relevant Orders  ? Ambulatory referral to Psychology  ? ?  ? ? ?Outpatient Encounter Medications as of 12/29/2021  ?Medication Sig  ? cyclobenzaprine (FLEXERIL) 5 MG tablet Take 1 tablet (5 mg total) by mouth 3 (three) times daily as needed for muscle spasms.  ? hydrochlorothiazide (MICROZIDE) 12.5 MG capsule Take 1 capsule (12.5 mg total) by mouth daily.  ? metoprolol tartrate (LOPRESSOR) 25 MG tablet TAKE 1 TABLET(25 MG) BY MOUTH TWICE DAILY  ? sertraline (ZOLOFT) 50 MG tablet Take 50 mg by mouth daily.  ? TRI-ESTARYLLA 0.18/0.215/0.25 MG-35 MCG tablet Take 1 tablet by mouth daily.  ? zolpidem (AMBIEN) 5 MG tablet Take 5 mg by mouth at bedtime as needed.  ? [DISCONTINUED] docusate sodium (COLACE) 100 MG capsule Take 200 mg by mouth 2 (two) times daily. (Patient not taking: Reported on 10/15/2021)  ? [DISCONTINUED] hydrocortisone-pramoxine (ANALPRAM-HC) 2.5-1 % rectal cream Place 1 application  rectally as needed for hemorrhoids. (Patient not taking: Reported on 08/03/2021)  ? [DISCONTINUED] ibuprofen (ADVIL) 600 MG tablet Take 1 tablet (600 mg total) by mouth every 6 (six) hours as needed for moderate pain. (Patient not taking: Reported  on 10/15/2021)  ? [DISCONTINUED] omeprazole (PRILOSEC) 20 MG capsule Take 20 mg by mouth daily. (Patient not taking: Reported on 08/03/2021)  ? [DISCONTINUED] ondansetron (ZOFRAN) 4 MG tablet Take 1 tablet by mouth as needed for nausea/vomiting. (Patient not taking: Reported on 08/03/2021)  ? [DISCONTINUED] polyethylene glycol (MIRALAX / GLYCOLAX) 17 g packet Take 17 g by mouth every other day. (Patient not taking: Reported on 10/15/2021)  ? [DISCONTINUED] Prenatal Vit-DSS-Fe Cbn-FA (PRENATAL AD PO) Take 1 tablet by mouth daily. Unknown strenght (Patient not taking: Reported on 10/15/2021)  ? [DISCONTINUED] promethazine (PHENERGAN) 25 MG tablet Take 1 tablet by mouth as needed for nausea/vomiting. (Patient not taking: Reported on 08/03/2021)  ? ?No facility-administered encounter medications on file as of 12/29/2021.  ? ? ?Follow-up: Return in about 8 months (around 08/31/2022) for CPE and labs - sertraline med check.  ? ?PLAN ?Continue currnet meds ?Refer to counseling ?Return for CPE and labs before end of year. ?Patient encouraged to call clinic with any questions, comments, or concerns. ? ?Janeece Agee, NP ? ?

## 2022-01-10 ENCOUNTER — Ambulatory Visit (INDEPENDENT_AMBULATORY_CARE_PROVIDER_SITE_OTHER): Payer: BC Managed Care – PPO | Admitting: Cardiology

## 2022-01-10 ENCOUNTER — Other Ambulatory Visit: Payer: Self-pay

## 2022-01-10 ENCOUNTER — Encounter: Payer: Self-pay | Admitting: Cardiology

## 2022-01-10 VITALS — BP 140/100 | HR 90 | Ht 68.0 in | Wt 174.0 lb

## 2022-01-10 DIAGNOSIS — F53 Postpartum depression: Secondary | ICD-10-CM | POA: Diagnosis not present

## 2022-01-10 DIAGNOSIS — I10 Essential (primary) hypertension: Secondary | ICD-10-CM

## 2022-01-10 MED ORDER — CARVEDILOL 6.25 MG PO TABS: 6.2500 mg | ORAL_TABLET | Freq: Two times a day (BID) | ORAL | 3 refills | Status: DC

## 2022-01-10 NOTE — Patient Instructions (Addendum)
Medication Instructions:  ?Your physician has recommended you make the following change in your medication:  ?STOP: Lopressor ?START: Coreg 6.25 mg twice daily ? ?Please take your blood pressure daily for 2 weeks and send in a MyChart message. Include heart rates.  ?*If you need a refill on your cardiac medications before your next appointment, please call your pharmacy* ? ? ?Lab Work: ?None ?If you have labs (blood work) drawn today and your tests are completely normal, you will receive your results only by: ?MyChart Message (if you have MyChart) OR ?A paper copy in the mail ?If you have any lab test that is abnormal or we need to change your treatment, we will call you to review the results. ? ? ?Testing/Procedures: ?None ? ? ?Follow-Up: ?At [facility], you and your health needs are our priority.  As part of our continuing mission to provide you with exceptional heart care, we have created designated Provider Care Teams.  These Care Teams include your primary Cardiologist (physician) and Advanced Practice Providers (APPs -  Physician Assistants and Nurse Practitioners) who all work together to provide you with the care you need, when you need it. ? ?We recommend signing up for the patient portal called "MyChart".  Sign up information is provided on this After Visit Summary.  MyChart is used to connect with patients for Virtual Visits (Telemedicine).  Patients are able to view lab/test results, encounter notes, upcoming appointments, etc.  Non-urgent messages can be sent to your provider as well.   ?To learn more about what you can do with MyChart, go to [email or web address].   ? ?Your next appointment:   ?12 week(s) ? ?The format for your next appointment:   ?In Person ? ?Provider:   ?Thomasene Ripple, DO   ? ? ?Other Instructions ?  ?

## 2022-01-10 NOTE — Progress Notes (Unsigned)
Cardio-Obstetrics Clinic  Follow Up Note   Date:  01/10/2022   ID:  Alicia Nguyen, DOB 1985/11/18, MRN [id number]  PCP:  Alicia Coss, NP   [facility] Providers Cardiologist:  None  Electrophysiologist:  None   { Click to update primary MD,subspecialty MD or APP then REFRESH:1}     Referring MD: No ref. provider found   Chief Complaint: " I am still having high blood pressure"  History of Present Illness:    Alicia Nguyen is a 36 y.o. female [id number] who returns for follow up of ***   Prior CV Studies Reviewed: The following studies were reviewed today: ***  Past Medical History:  Diagnosis Date   Anxiety 2010   Depression 11/14/2021   Post partum depression   Hypertension    Preeclampsia    Pregnancy induced hypertension    Pregnant     Past Surgical History:  Procedure Laterality Date   BRAIN SURGERY  1989   Optic glioma removal   EYE SURGERY     Strabismus right eye   FOOT FRACTURE SURGERY     Optic gioma removal     REFRACTIVE SURGERY     { Click here to update PMH, PSH, OB Hx then refresh note  :1}   OB History     Gravida  2   Para  2   Term  2   Preterm  0   AB  0   Living  1      SAB  0   IAB  0   Ectopic  0   Multiple  0   Live Births  1           { Click here to update OB Charting then refresh note  :1}    Current Medications: Current Meds  Medication Sig   cyclobenzaprine (FLEXERIL) 5 MG tablet Take 1 tablet (5 mg total) by mouth 3 (three) times daily as needed for muscle spasms.   hydrochlorothiazide (MICROZIDE) 12.5 MG capsule Take 1 capsule (12.5 mg total) by mouth daily.   metoprolol tartrate (LOPRESSOR) 25 MG tablet TAKE 1 TABLET(25 MG) BY MOUTH TWICE DAILY   Prenatal Vit-Fe Fumarate-FA (PRENATAL VITAMIN PO) Take by mouth daily.   sertraline (ZOLOFT) 50 MG tablet Take 50 mg by mouth daily.   TRI-ESTARYLLA 0.18/0.215/0.25 MG-35 MCG tablet Take 1 tablet by mouth daily.   zolpidem (AMBIEN) 5 MG tablet  Take 5 mg by mouth at bedtime as needed.     Allergies:   Patient has no known allergies.   Social History   Socioeconomic History   Marital status: Married    Spouse name: Not on file   Number of children: 2   Years of education: Not on file   Highest education level: Not on file  Occupational History   Not on file  Tobacco Use   Smoking status: Never   Smokeless tobacco: Never  Vaping Use   Vaping Use: Never used  Substance and Sexual Activity   Alcohol use: Not Currently   Drug use: Never   Sexual activity: Yes    Birth control/protection: Pill  Other Topics Concern   Not on file  Social History Narrative   Not on file   Social Determinants of Health   Financial Resource Strain: Not on file  Food Insecurity: Unknown   Worried About [place] in the Last Year: Never true   Ran Out of Food in the Last Year: Not on file  Transportation Needs: Unknown   Film/video editor (Medical): No   Lack of Transportation (Non-Medical): Not on file  Physical Activity: Not on file  Stress: Not on file  Social Connections: Not on file  { Click here to update SDOH then refresh :1}    Family History  Problem Relation Age of Onset   Diabetes Mother    Hypertension Mother    Miscarriages / [place] Mother    Skin cancer Father    Colon polyps Father    Hypertension Father    Diabetes Maternal Grandfather    Skin cancer Maternal Grandfather    Diabetes Paternal Grandmother    CVA Paternal Grandmother    Dementia Maternal Aunt    Cervical cancer Paternal Aunt    Colon cancer Neg Hx    Liver disease Neg Hx    Esophageal cancer Neg Hx    Stomach cancer Neg Hx    { Click here to update FH then refresh note    :1}   ROS:   Please see the history of present illness.    *** All other systems reviewed and are negative.   Labs/EKG Reviewed:    EKG:   EKG is *** ordered today.  The ekg ordered today demonstrates ***  Recent Labs: 07/17/2021: ALT  13 07/25/2021: Hemoglobin 9.1; Platelets 242 12/06/2021: BUN 11; Creatinine, Ser 0.89; Magnesium 2.0; Potassium 4.6; Sodium 142   Recent Lipid Panel No results found for: CHOL, TRIG, HDL, CHOLHDL, LDLCALC, LDLDIRECT  Physical Exam:    VS:  BP (!) 140/100    Pulse 90    Ht 5\' 8"  (1.727 m)    Wt 174 lb (78.9 kg)    LMP 12/31/2021    SpO2 99%    Breastfeeding No    BMI 26.46 kg/m     Wt Readings from Last 3 Encounters:  01/10/22 174 lb (78.9 kg)  12/29/21 172 lb (78 kg)  10/15/21 175 lb 12.8 oz (79.7 kg)     GEN: *** Well nourished, well developed in no acute distress HEENT: Normal NECK: No JVD; No carotid bruits LYMPHATICS: No lymphadenopathy CARDIAC: ***RRR, no murmurs, rubs, gallops RESPIRATORY:  Clear to auscultation without rales, wheezing or rhonchi  ABDOMEN: Soft, non-tender, non-distended MUSCULOSKELETAL:  No edema; No deformity  SKIN: Warm and dry NEUROLOGIC:  Alert and oriented x 3 PSYCHIATRIC:  Normal affect    Risk Assessment/Risk Calculators:   { Click to calculate CARPREG II - THEN refresh note :1}    { Click to caclulate Mod WHO Class of CV Risk - THEN refresh note :1}     { Click for [id number] Score - THEN Refresh Note    :[id number]      ASSESSMENT & PLAN:    *** There are no Patient Instructions on file for this visit.   Dispo:  No follow-ups on file.   Medication Adjustments/Labs and Tests Ordered: Current medicines are reviewed at length with the patient today.  Concerns regarding medicines are outlined above.  Tests Ordered: No orders of the defined types were placed in this encounter.  Medication Changes: No orders of the defined types were placed in this encounter.

## 2022-01-12 IMAGING — US US MFM OB FOLLOW-UP
1 series · 14 of 28 positions shown · non-contrast
Comparison: none

[Series 1: us mfm ob follow-up · 14 of 51 slices shown]
[im 2/51]
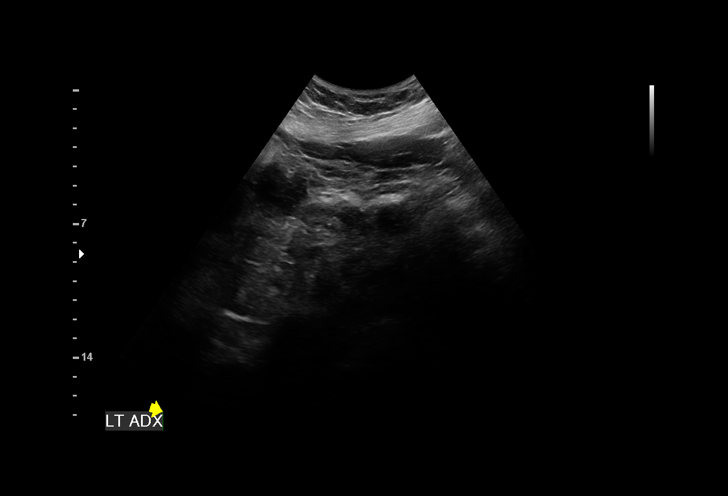
[im 6/51]
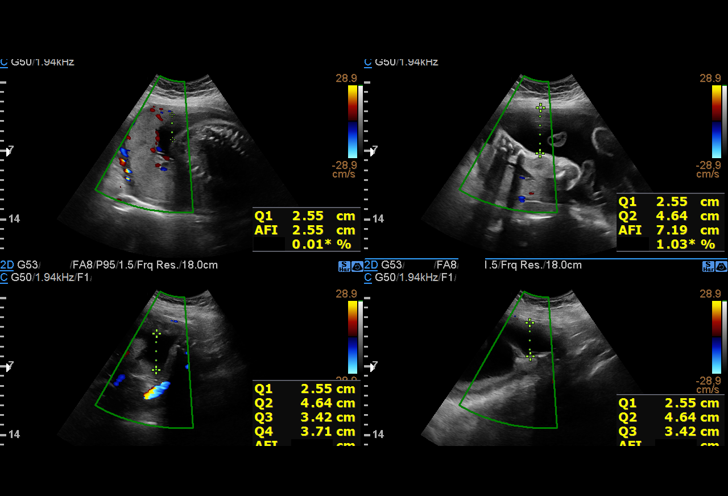
[im 10/51]
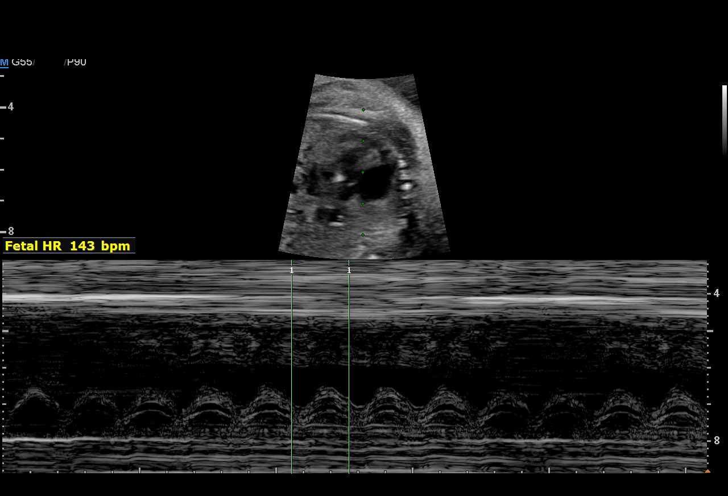
[im 13/51]
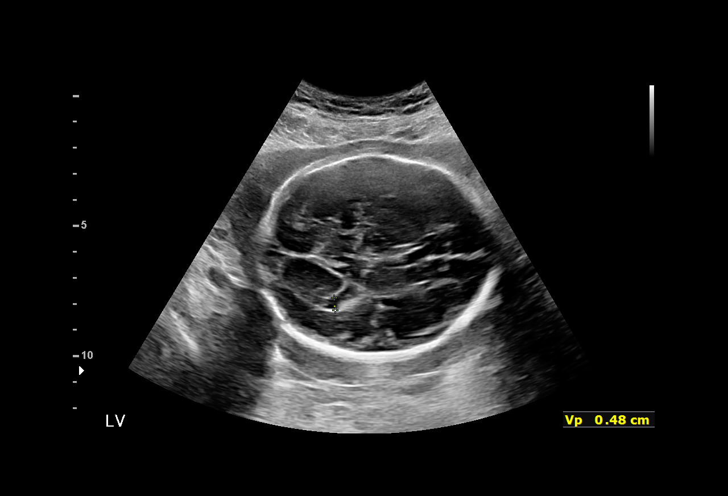
[im 17/51]
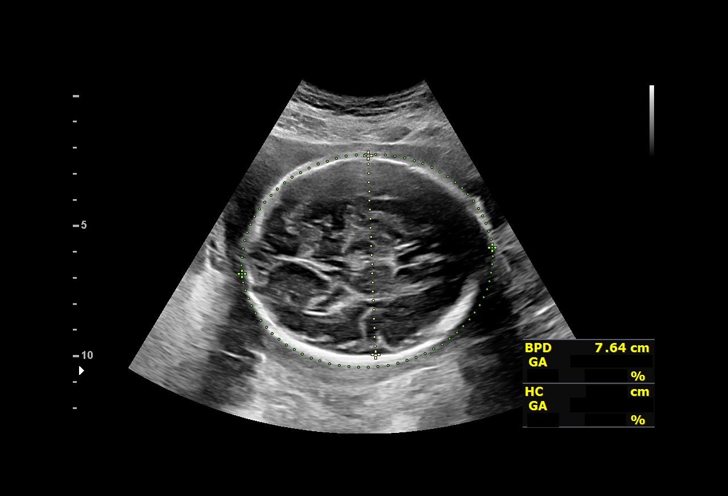
[im 21/51]
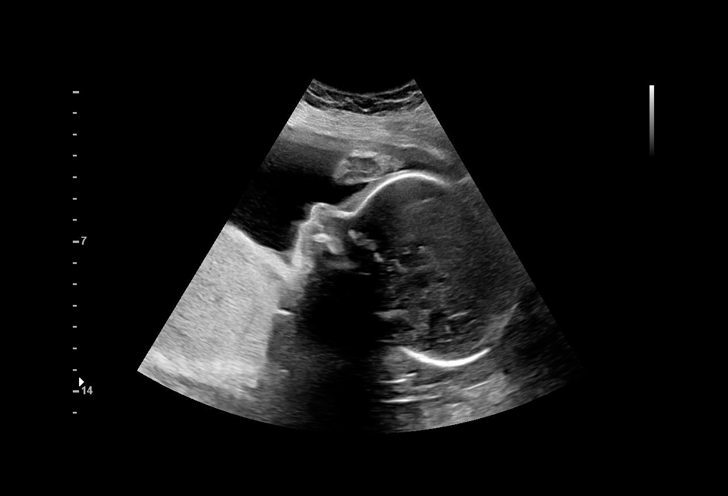
[im 25/51]
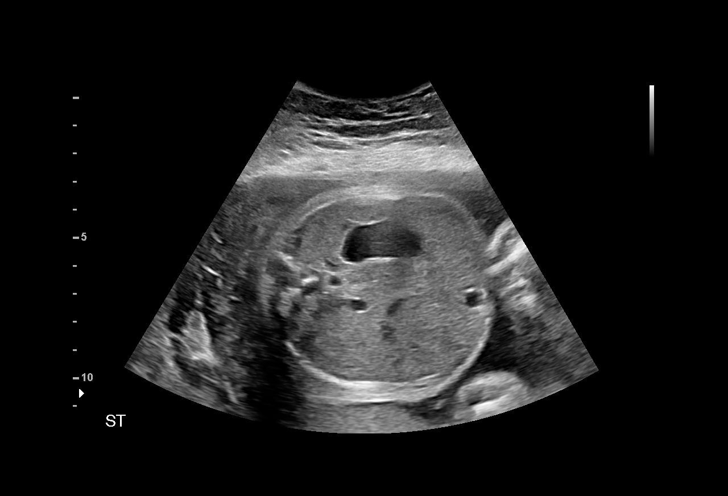
[im 28/51]
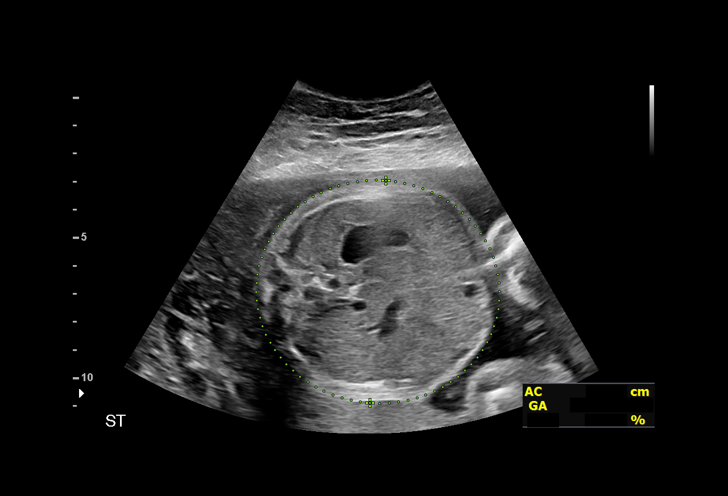
[im 32/51]
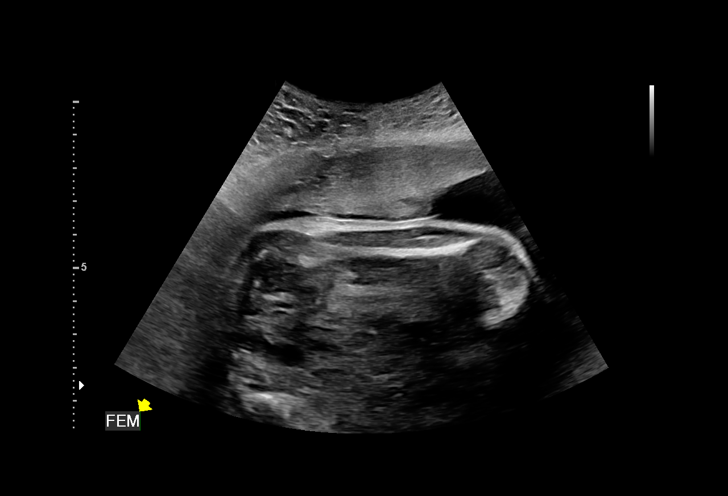
[im 36/51]
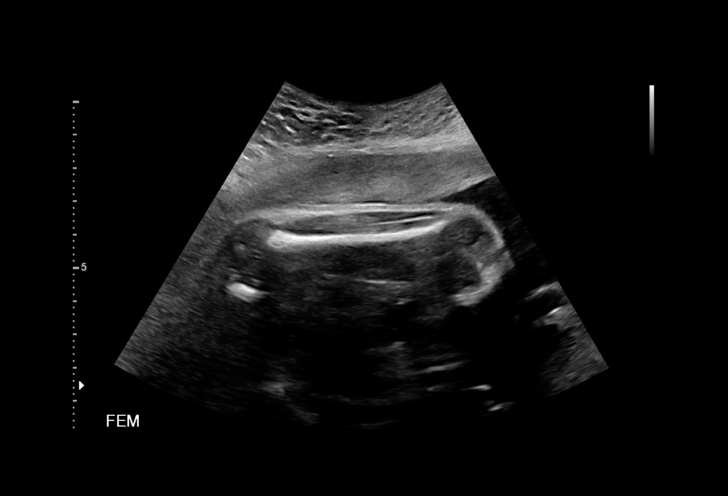
[im 39/51]
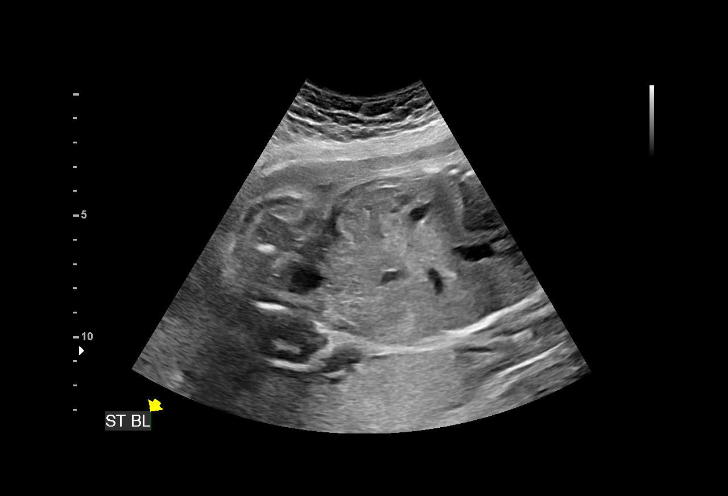
[im 43/51]
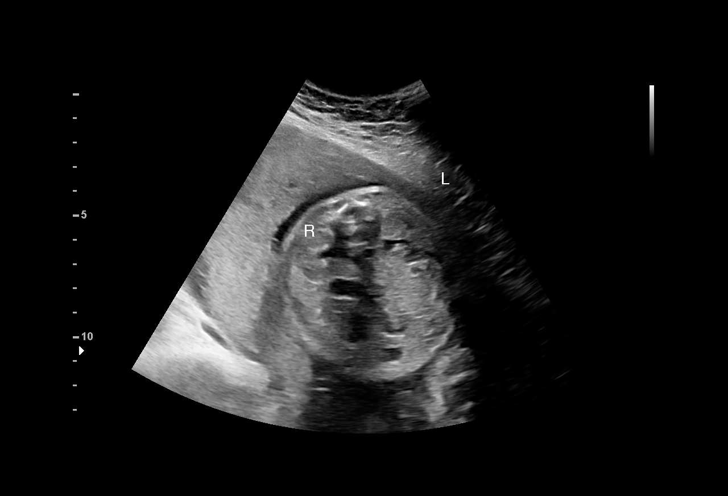
[im 47/51]
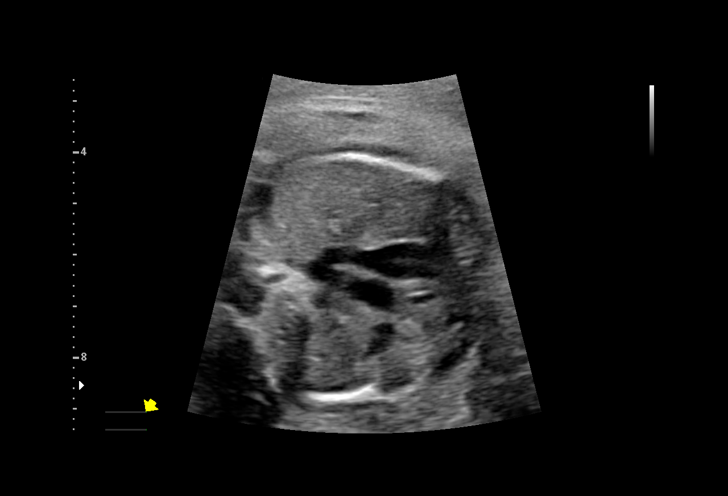
[im 51/51]
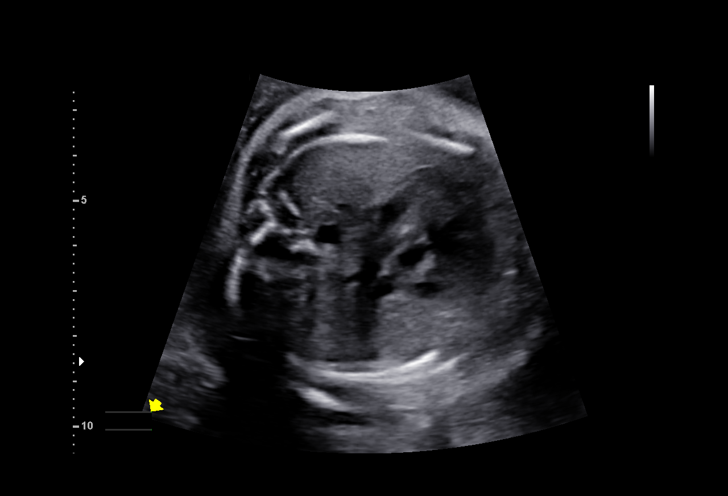

[14 of 28 positions shown; findings below may reference images not displayed]

OBGYN
                                                            [REDACTED]
                   ODMAH DOSTUPNO

Indications

 Advanced maternal age primigravida 35+,
 third trimester
 Pre-existing essential hypertension
 complicating pregnancy, third trimester (no
 meds)
 Encounter for other antenatal screening
 follow-up
 Poor obstetric history: Previous
 preeclampsia / eclampsia/gestational HTN
 Subchorionic hemorrhage, antepartum
 Low risk NIPS, neg AFP
 28 weeks gestation of pregnancy
Fetal Evaluation

 Num Of Fetuses:         1
 Preg. Location:         Intrauterine
 Fetal Heart Rate(bpm):  143
 Cardiac Activity:       Observed
 Presentation:           Cephalic
 Placenta:               Posterior Fundal

 Amniotic Fluid
 AFI FV:      Within normal limits

 AFI Sum(cm)     %Tile       Largest Pocket(cm)
 14.32           48
 RUQ(cm)       RLQ(cm)       LUQ(cm)        LLQ(cm)

Biometry

 BPD:        76  mm     G. Age:  30w 3d         92  %    CI:        74.21   %    70 - 86
                                                         FL/HC:      19.0   %    18.8 -
 HC:      280.1  mm     G. Age:  30w 5d         84  %    HC/AC:      1.07        1.05 -
 AC:       261   mm     G. Age:  30w 2d         90  %    FL/BPD:     70.1   %    71 - 87
 FL:       53.3  mm     G. Age:  28w 2d         31  %    FL/AC:      20.4   %    20 - 24
 HUM:      47.2  mm     G. Age:  27w 5d         31  %
 LV:        4.8  mm

 Est. FW:    5109  gm      3 lb 3 oz     83  %
OB History

 Gravidity:    1
 Living:       0
Gestational Age

 LMP:           28w 3d        Date:  10/25/20                 EDD:   08/01/21
 U/S Today:     30w 0d                                        EDD:   07/21/21
 Best:          28w 3d     Det. By:  LMP  (10/25/20)          EDD:   08/01/21
Anatomy

 Cranium:               Appears normal         Diaphragm:              Appears normal
 Cavum:                 Appears normal         Stomach:                Appears normal, left
                                                                       sided
 Ventricles:            Appears normal         Kidneys:                Appear normal
 Face:                  Profile appears        Bladder:                Appears normal
                        normal
 Heart:                 Appears normal         Upper Extremities:      Visualized
                        (4CH, axis, and
                        situs)
 RVOT:                  Limited Views          Lower Extremities:      Visualized
 LVOT:                  Appears normal
Cervix Uterus Adnexa

 Cervix
 Normal appearance by transabdominal scan.

 Right Ovary
 Not visualized.

 Left Ovary
 Not visualized.
Impression

 Chronic hypertension.  Patient does not take
 antihypertensives.  Blood pressure today at her office is
 143/91 mmHg.

 Amniotic fluid is normal and good fetal activity seen.Fetal
 growth is appropriate for gestational age .  Both kidneys
 appear normal and the urinary tract dilations seen on
 previous scan have resolved.
 We reassured the patient of the findings.
Recommendations

 -An appointment was made for her to return in 4 weeks for
 fetal growth assessment.
                 Valderruten, Wilder Enrique

## 2022-01-18 DIAGNOSIS — F418 Other specified anxiety disorders: Secondary | ICD-10-CM | POA: Diagnosis not present

## 2022-01-30 ENCOUNTER — Encounter: Payer: Self-pay | Admitting: Registered Nurse

## 2022-01-31 ENCOUNTER — Ambulatory Visit (INDEPENDENT_AMBULATORY_CARE_PROVIDER_SITE_OTHER): Payer: BC Managed Care – PPO | Admitting: Registered Nurse

## 2022-01-31 ENCOUNTER — Encounter: Payer: Self-pay | Admitting: Registered Nurse

## 2022-01-31 VITALS — BP 128/62 | HR 89 | Temp 98.0°F | Resp 18 | Ht 68.0 in | Wt 170.0 lb

## 2022-01-31 DIAGNOSIS — H9202 Otalgia, left ear: Secondary | ICD-10-CM | POA: Diagnosis not present

## 2022-01-31 MED ORDER — PREDNISONE 20 MG PO TABS: 20.0000 mg | ORAL_TABLET | Freq: Every day | ORAL | 0 refills | Status: DC

## 2022-01-31 MED ORDER — HYDROCORTISONE-ACETIC ACID 1-2 % OT SOLN: 3.0000 [drp] | Freq: Two times a day (BID) | OTIC | 0 refills | Status: DC

## 2022-01-31 MED ORDER — AMOXICILLIN 875 MG PO TABS: 875.0000 mg | ORAL_TABLET | Freq: Two times a day (BID) | ORAL | 0 refills | Status: AC

## 2022-01-31 NOTE — Patient Instructions (Addendum)
Ms. Nowaczyk -  ? ?Great to see you ? ?Medications as instructed. ? ?If no resolution in one week, let me know ? ?Thanks ? ?Rich  ? ? ? ?If you have lab work done today you will be contacted with your lab results within the next 2 weeks.  If you have not heard from [place] then please contact us. The fastest way to get your results is to register for My Chart. ? ? ?IF you received an x-ray today, you will receive an invoice from [facility]. Please contact  [facility] at [phone] with questions or concerns regarding your invoice.  ? ?IF you received labwork today, you will receive an invoice from [place]. Please contact [facility] at [phone] with questions or concerns regarding your invoice.  ? ?Our billing staff will not be able to assist you with questions regarding bills from these companies. ? ?You will be contacted with the lab results as soon as they are available. The fastest way to get your results is to activate your My Chart account. Instructions are located on the last page of this paperwork. If you have not heard from [place] regarding the results in 2 weeks, please contact this office. ?  ? ? ?

## 2022-01-31 NOTE — Progress Notes (Signed)
? ?Acute Office Visit ? ?Subjective:  ? ? Patient ID: Mahi Zabriskie, female    DOB: August 09, 1986, 36 y.o.   MRN: [id number] ? ?Chief Complaint  ?Patient presents with  ? Ear Pain  ?  Patient states she has been having some left ear pain for 2 weeks.   ? ? ?HPI ?Patient is in today for ear pain  ? ?Ongoing x 2 weeks ?Some drainage ?No changes to hearing ?Feels like local adenopathy ?No dysphagia ?Some R side headache  ?Some R side sinus pressure ? ?Otherwise no symptoms.  ? ?Outpatient Medications Prior to Visit  ?Medication Sig Dispense Refill  ? carvedilol (COREG) 6.25 MG tablet Take 1 tablet (6.25 mg total) by mouth 2 (two) times daily. 180 tablet 3  ? hydrochlorothiazide (MICROZIDE) 12.5 MG capsule Take 1 capsule (12.5 mg total) by mouth daily. 90 capsule 3  ? Prenatal Vit-Fe Fumarate-FA (PRENATAL VITAMIN PO) Take by mouth daily.    ? sertraline (ZOLOFT) 50 MG tablet Take 100 mg by mouth daily.    ? cyclobenzaprine (FLEXERIL) 5 MG tablet Take 1 tablet (5 mg total) by mouth 3 (three) times daily as needed for muscle spasms. 30 tablet 1  ? TRI-ESTARYLLA 0.18/0.215/0.25 MG-35 MCG tablet Take 1 tablet by mouth daily.    ? zolpidem (AMBIEN) 5 MG tablet Take 5 mg by mouth at bedtime as needed.    ? ?No facility-administered medications prior to visit.  ? ? ?Review of Systems ?Per hpi  ? ?   ?Objective:  ?  ?BP 128/62   Pulse 89   Temp 98 ?F (36.7 ?C) (Temporal)   Resp 18   Ht 5\' 8"  (1.727 m)   Wt 170 lb (77.1 kg)   SpO2 98%   BMI 25.85 kg/m?  ?Physical Exam ?Vitals and nursing note reviewed.  ?Constitutional:   ?   General: She is not in acute distress. ?   Appearance: Normal appearance. She is normal weight. She is not ill-appearing, toxic-appearing or diaphoretic.  ?HENT:  ?   Right Ear: Tympanic membrane, ear canal and external ear normal. There is no impacted cerumen.  ?   Left Ear: Ear canal and external ear normal. There is no impacted cerumen.  ?   Ears:  ?   Comments: L canal tender, outer ear tender. TM  partially visualized, looks as pressure behind TM but no rupture. Preauricular and posterior auricular nodes exquisitely tender.  ?Cardiovascular:  ?   Rate and Rhythm: Normal rate and regular rhythm.  ?   Heart sounds: Normal heart sounds. No murmur heard. ?  No friction rub. No gallop.  ?Pulmonary:  ?   Effort: Pulmonary effort is normal. No respiratory distress.  ?   Breath sounds: Normal breath sounds. No stridor. No wheezing, rhonchi or rales.  ?Chest:  ?   Chest wall: No tenderness.  ?Skin: ?   General: Skin is warm and dry.  ?Neurological:  ?   General: No focal deficit present.  ?   Mental Status: She is alert and oriented to person, place, and time. Mental status is at baseline.  ?Psychiatric:     ?   Mood and Affect: Mood normal.     ?   Behavior: Behavior normal.     ?   Thought Content: Thought content normal.     ?   Judgment: Judgment normal.  ? ? ?No results found for any visits on 01/31/22. ? ? ?   ?Assessment & Plan:  ?1. Left  ear pain ?- amoxicillin (AMOXIL) 875 MG tablet; Take 1 tablet (875 mg total) by mouth 2 (two) times daily for 10 days.  Dispense: 20 tablet; Refill: 0 ?- predniSONE (DELTASONE) 20 MG tablet; Take 1 tablet (20 mg total) by mouth daily with breakfast.  Dispense: 5 tablet; Refill: 0 ?- acetic acid-hydrocortisone (VOSOL-HC) OTIC solution; Place 3 drops into the left ear 2 (two) times daily.  Dispense: 10 mL; Refill: 0 ? ? ? ?Meds ordered this encounter  ?Medications  ? amoxicillin (AMOXIL) 875 MG tablet  ?  Sig: Take 1 tablet (875 mg total) by mouth 2 (two) times daily for 10 days.  ?  Dispense:  20 tablet  ?  Refill:  0  ?  Order Specific Question:   Supervising Provider  ?  Answer:   Neva Seat, JEFFREY R [[id number]]  ? predniSONE (DELTASONE) 20 MG tablet  ?  Sig: Take 1 tablet (20 mg total) by mouth daily with breakfast.  ?  Dispense:  5 tablet  ?  Refill:  0  ?  Order Specific Question:   Supervising Provider  ?  Answer:   Neva Seat, JEFFREY R [[id number]]  ? acetic acid-hydrocortisone  (VOSOL-HC) OTIC solution  ?  Sig: Place 3 drops into the left ear 2 (two) times daily.  ?  Dispense:  10 mL  ?  Refill:  0  ?  Order Specific Question:   Supervising Provider  ?  Answer:   Neva Seat, JEFFREY R [[id number]]  ? ? ?Return if symptoms worsen or fail to improve. ? ?PLAN ?Amoxicillin and prednisone as above. Vosol for canal sensitivity. ?Return if worsening or failing to improve. Consideration for trigeminal neuralgia depending on response ?Patient encouraged to call clinic with any questions, comments, or concerns. ? ?Janeece Agee, NP ?

## 2022-02-02 DIAGNOSIS — H0288A Meibomian gland dysfunction right eye, upper and lower eyelids: Secondary | ICD-10-CM | POA: Diagnosis not present

## 2022-02-02 DIAGNOSIS — H47039 Optic nerve hypoplasia, unspecified eye: Secondary | ICD-10-CM | POA: Diagnosis not present

## 2022-02-02 DIAGNOSIS — H0288B Meibomian gland dysfunction left eye, upper and lower eyelids: Secondary | ICD-10-CM | POA: Diagnosis not present

## 2022-02-02 DIAGNOSIS — H5212 Myopia, left eye: Secondary | ICD-10-CM | POA: Diagnosis not present

## 2022-02-02 DIAGNOSIS — H1045 Other chronic allergic conjunctivitis: Secondary | ICD-10-CM | POA: Diagnosis not present

## 2022-02-23 IMAGING — US US MFM FETAL BPP W/O NON-STRESS
1 series · 15 of 18 positions shown · non-contrast
Comparison: none

[Series 1: us mfm fetal bpp w/o non-stress · 18 acquisitions, 15 frames shown]
[im 1/18]
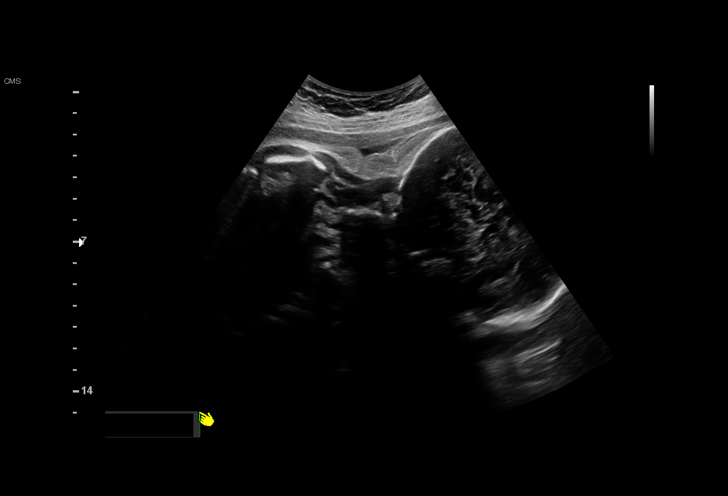
[im 2/18]
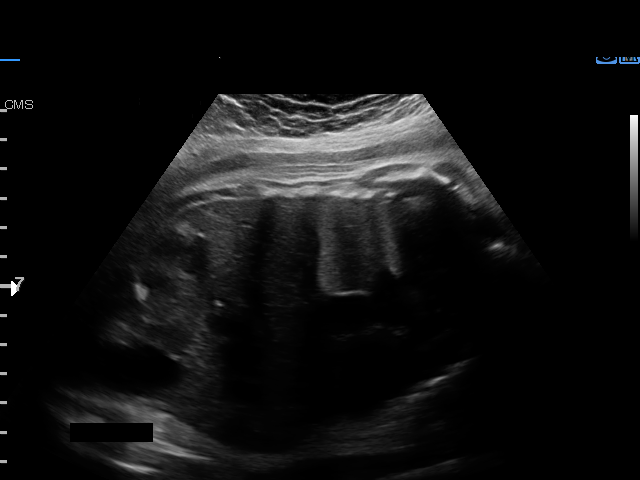
[im 4/18]
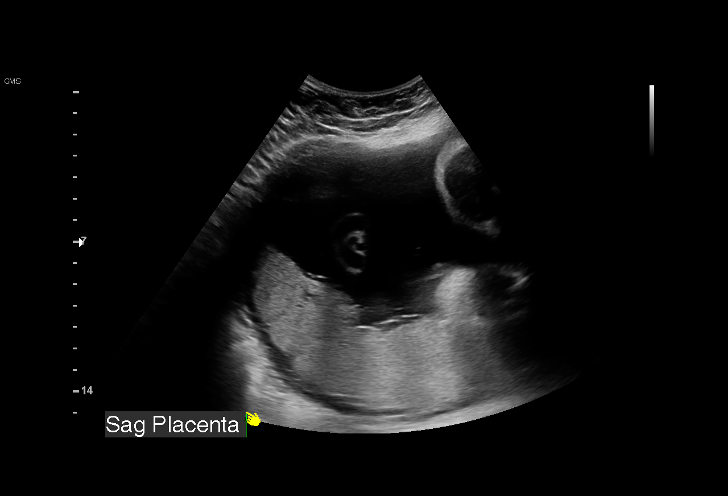
[im 5/18]
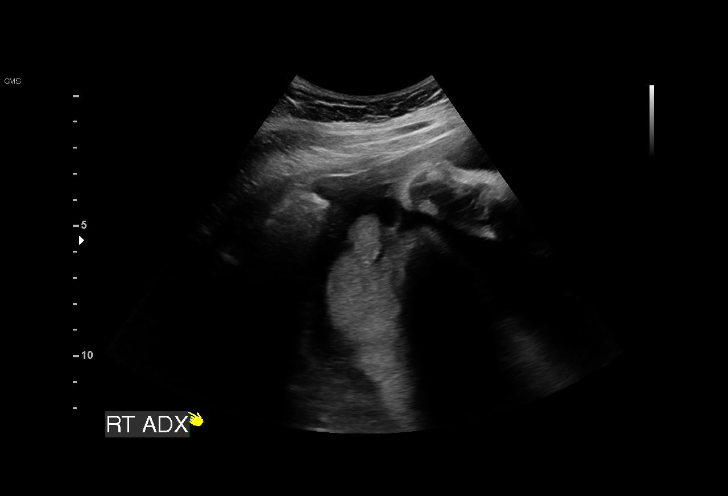
[im 6/18]
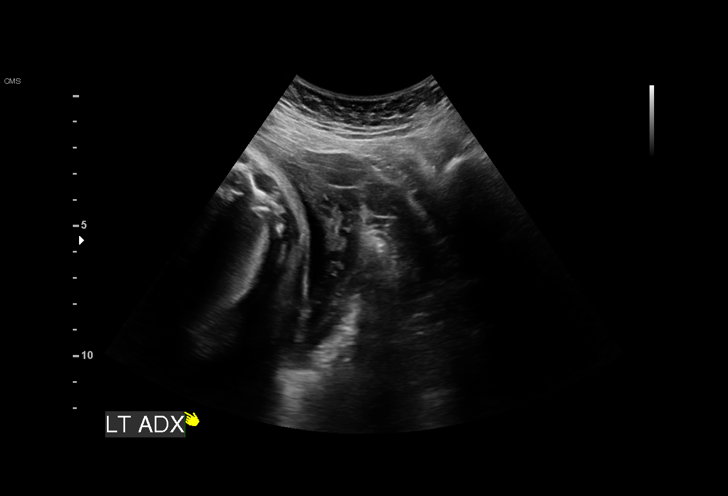
[im 7/18]
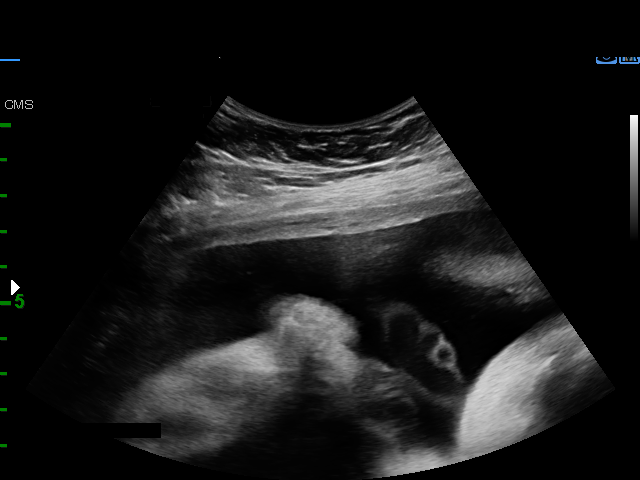
[im 8/18]
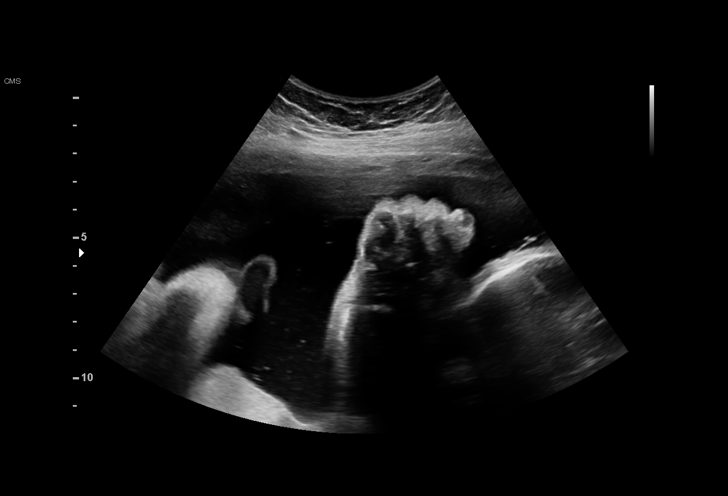
[im 10/18]
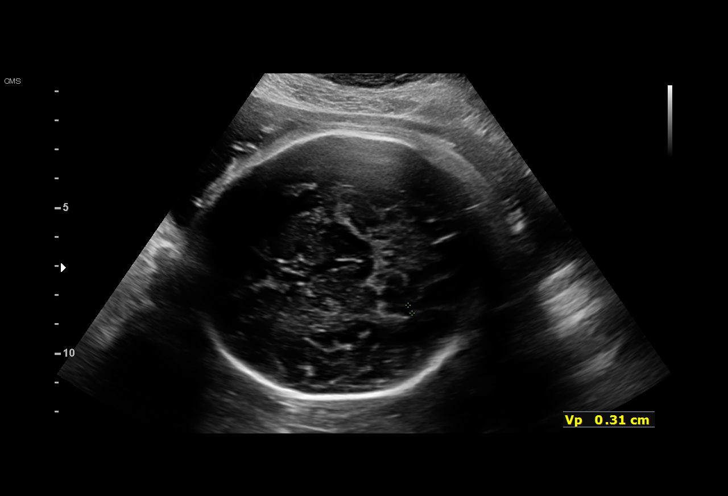
[im 11/18]
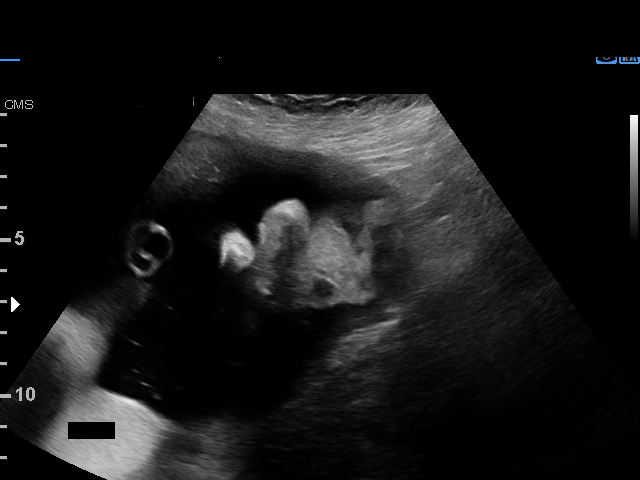
[im 12/18]
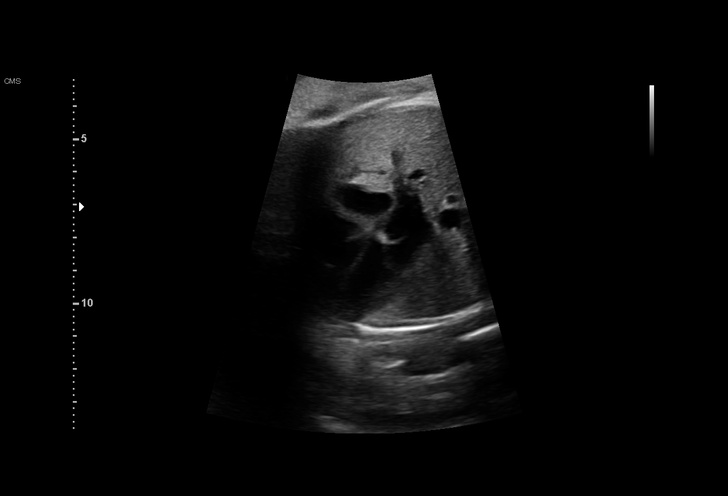
[im 13/18]
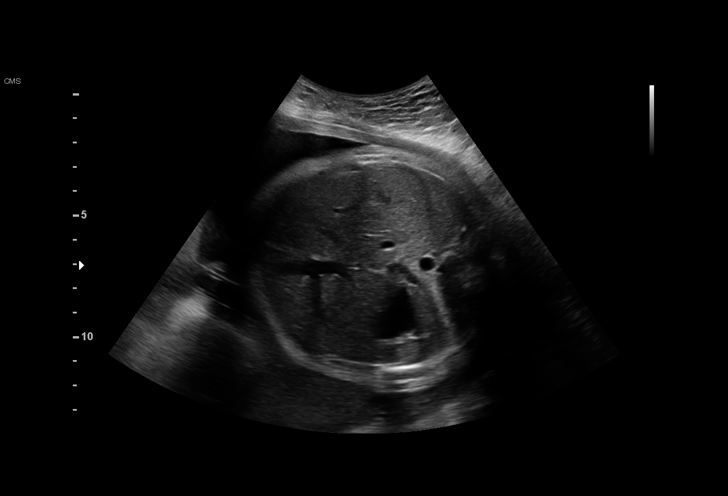
[im 14/18]
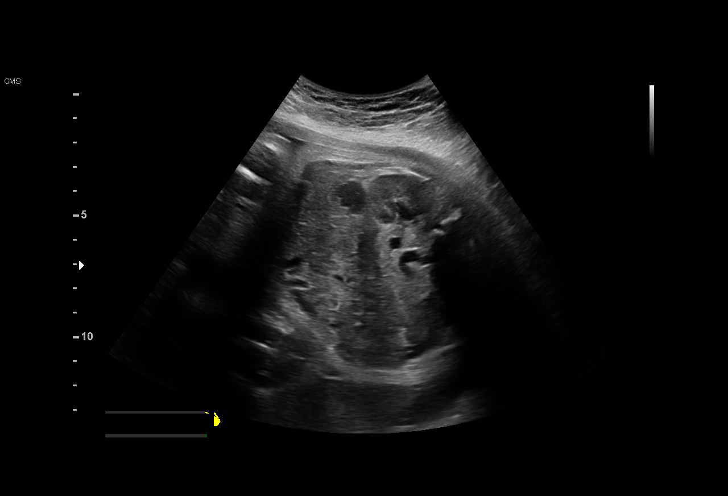
[im 16/18]
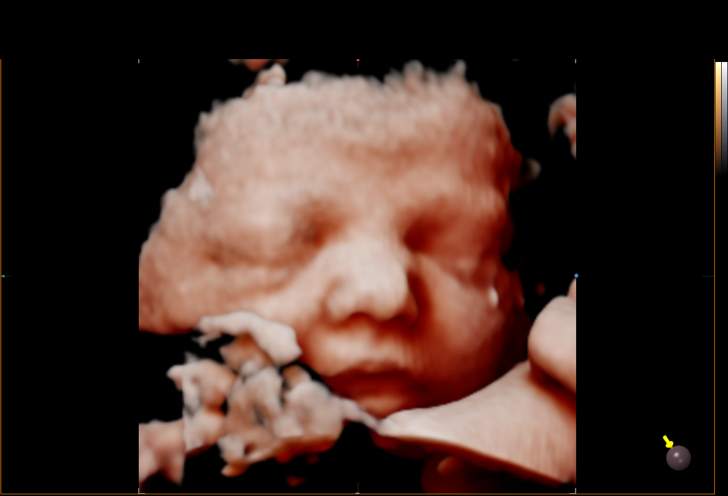
[im 17/18]
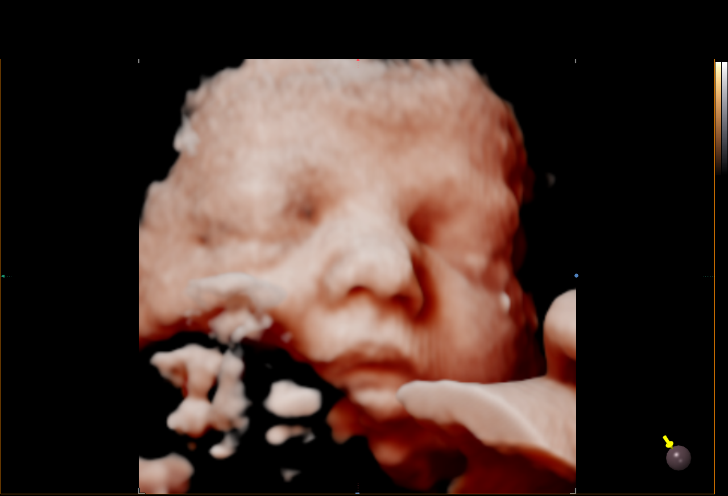
[im 18/18]
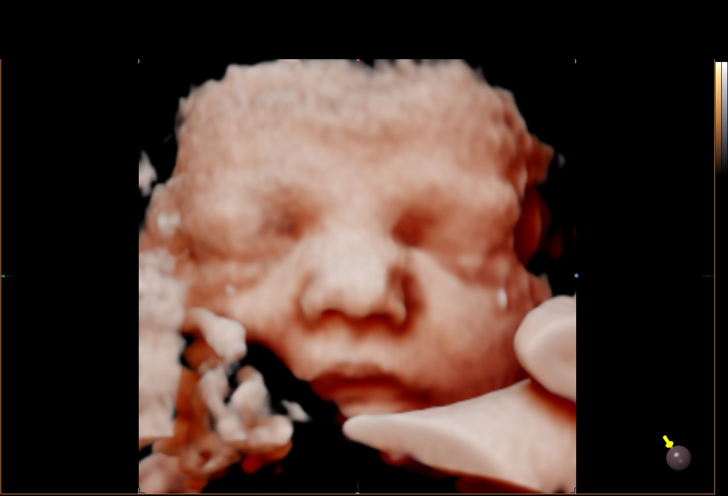

[15 of 18 positions shown; findings below may reference images not displayed]

OBGYN
                                                            [REDACTED]
                   [facility]

Indications

 34 weeks gestation of pregnancy
 Advanced maternal age primigravida 35+,
 third trimester
 Pre-existing essential hypertension
 complicating pregnancy, third trimester (no
 meds)
 Poor obstetric history: Previous
 preeclampsia / eclampsia/gestational HTN
 Subchorionic hemorrhage, antepartum
 Low risk NIPS, neg AFP
Fetal Evaluation

 Num Of Fetuses:         1
 Fetal Heart Rate(bpm):  155
 Cardiac Activity:       Observed
 Presentation:           Cephalic
 Placenta:               Posterior Fundal
 P. Cord Insertion:      Previously Visualized

 Amniotic Fluid
 AFI FV:      Within normal limits

 AFI Sum(cm)     %Tile       Largest Pocket(cm)
 19.48           73
 RUQ(cm)       RLQ(cm)       LUQ(cm)        LLQ(cm)

Biophysical Evaluation

 Amniotic F.V:   Within normal limits       F. Tone:        Observed
 F. Movement:    Observed                   Score:          [DATE]
 F. Breathing:   Observed
Biometry

 LV:        3.1  mm
OB History

 Gravidity:    1
 Living:       0
Gestational Age

 LMP:           34w 3d        Date:  10/25/20                 EDD:   08/01/21
 Best:          34w 3d     Det. By:  LMP  (10/25/20)          EDD:   08/01/21
Anatomy

 Ventricles:            Appears normal         Kidneys:                Appear normal
 Heart:                 Appears normal         Bladder:                Appears normal
                        (4CH, axis, and
                        situs)
 Stomach:               Appears normal, left
                        sided
Cervix Uterus Adnexa

 Cervix
 Not visualized (advanced GA >72wks)

 Right Ovary
 Not visualized.

 Left Ovary
 Not visualized.
Impression

 Antenatal testing performed given maternal chronic
 hypertension on medications
 The biophysical profile was [DATE] with good fetal movement and
 amniotic fluid volume.
 Blood pressure 130/92 mmHg.
Recommendations

 Continue weekly testing
 Next growth scheduled on [DATE]

## 2022-03-17 IMAGING — US US MFM FETAL BPP W/O NON-STRESS
1 series · 15 of 28 positions shown · non-contrast
Comparison: none

[Series 1: us mfm fetal bpp w/o non-stress · 45 acquisitions, 15 frames shown]
[im 1/45]
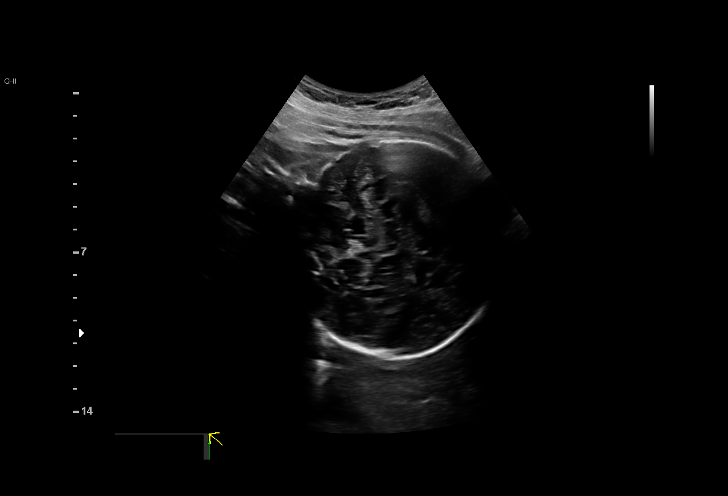
[im 4/45]
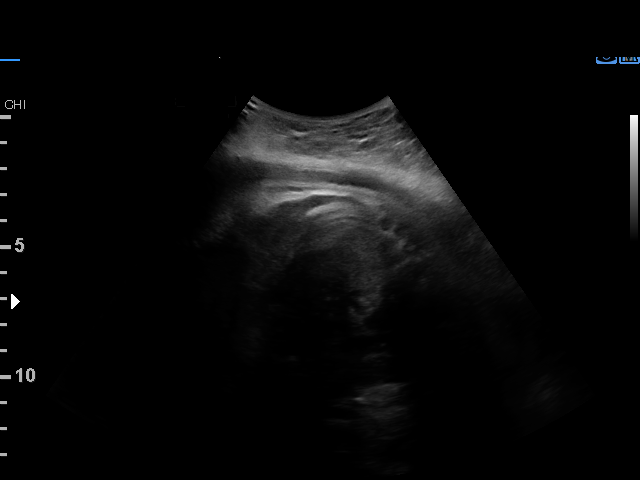
[im 7/45]
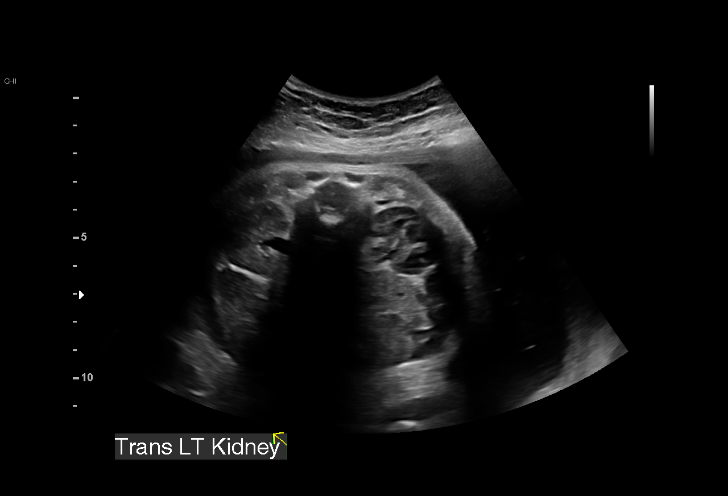
[im 10/45]
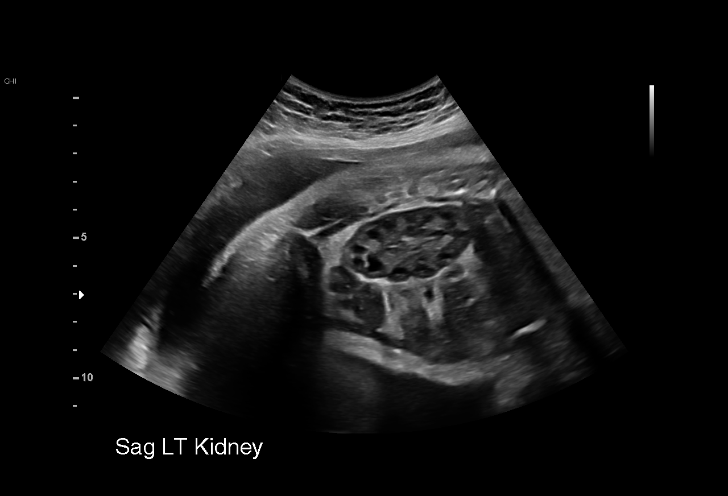
[im 14/45]
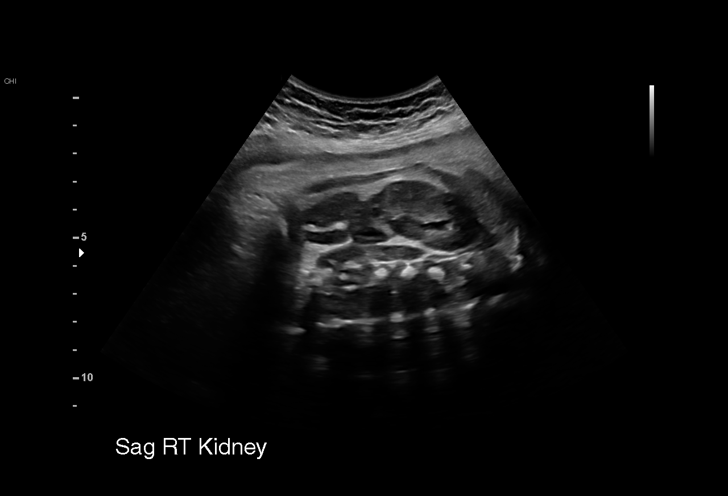
[im 17/45]
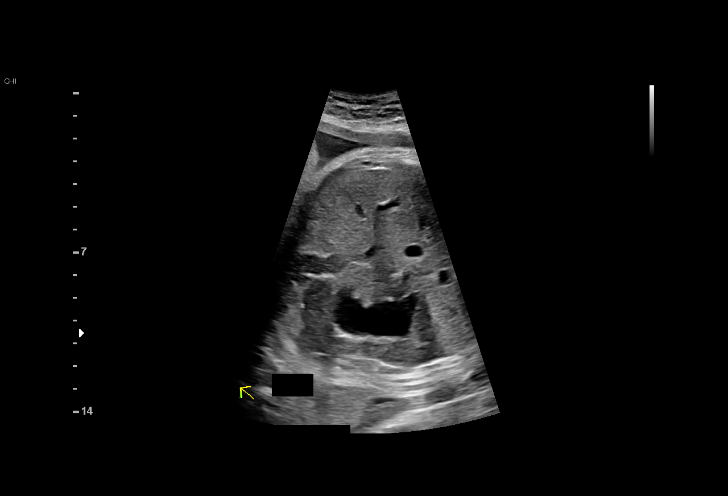
[im 20/45]
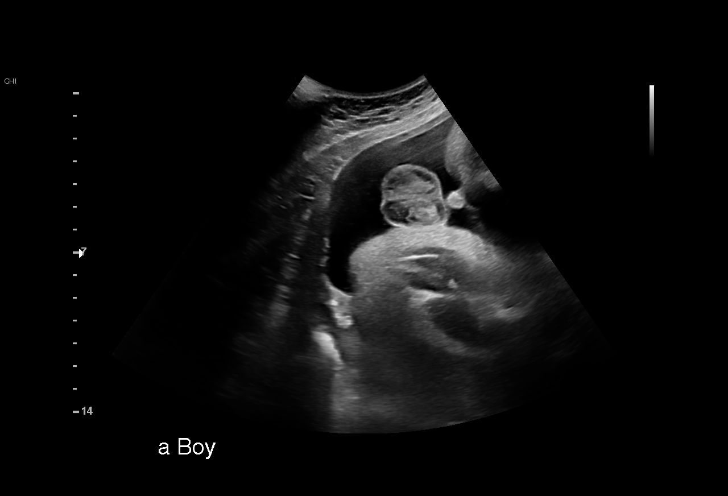
[im 23/45]
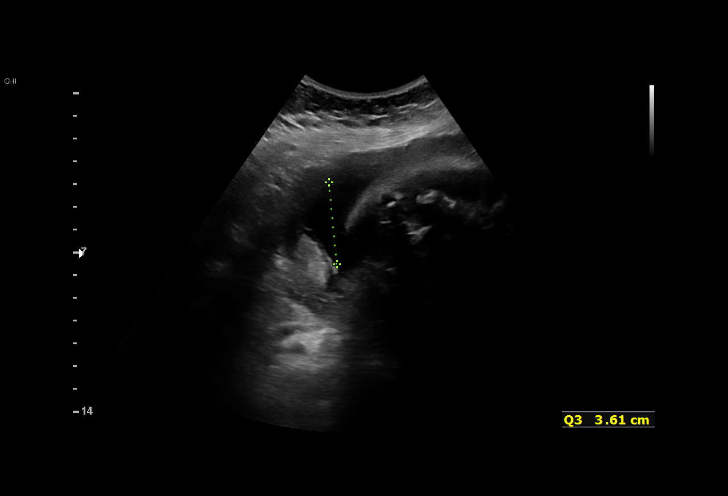
[im 25/45]
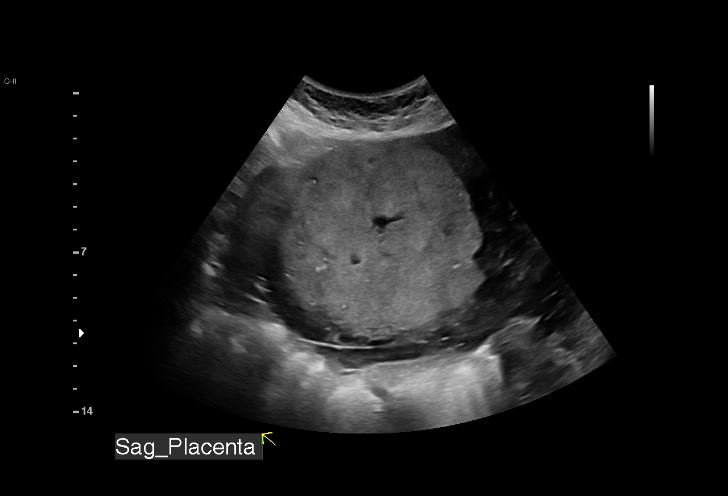
[im 28/45]
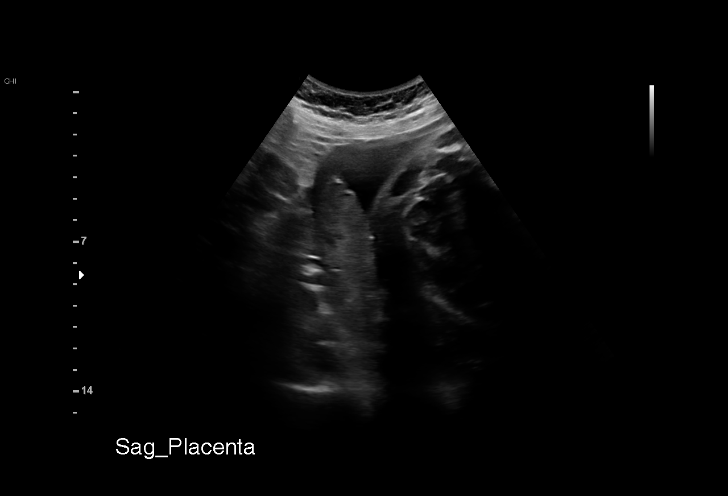
[im 31/45]
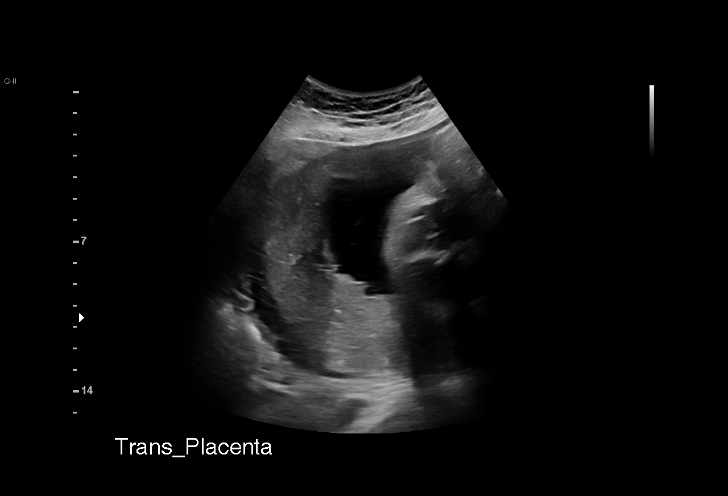
[im 35/45]
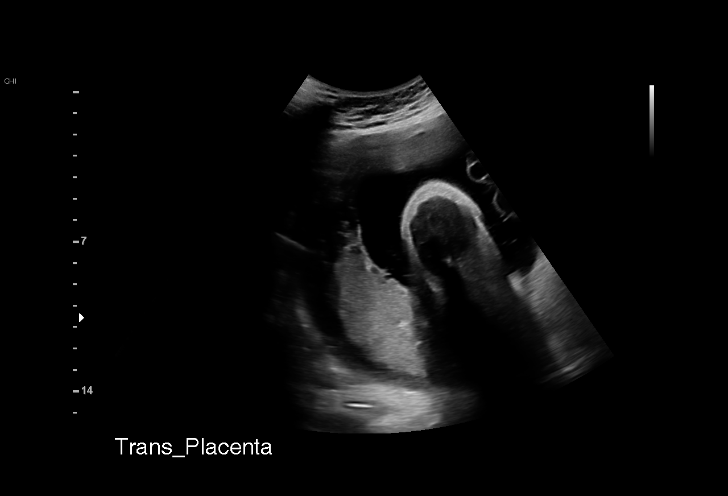
[im 38/45]
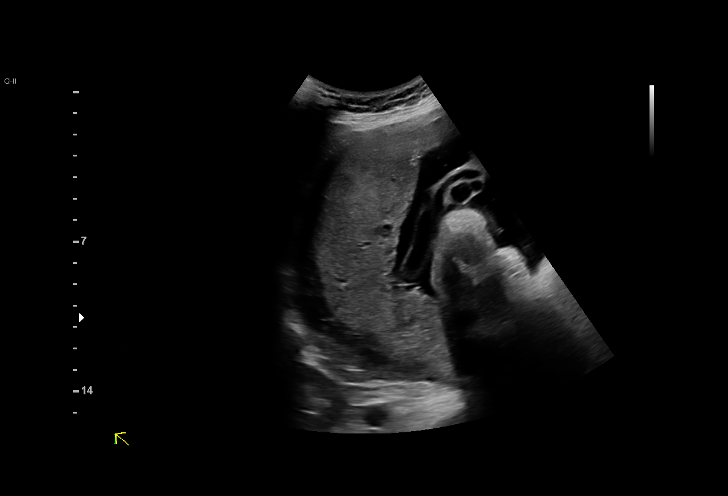
[im 41/45]
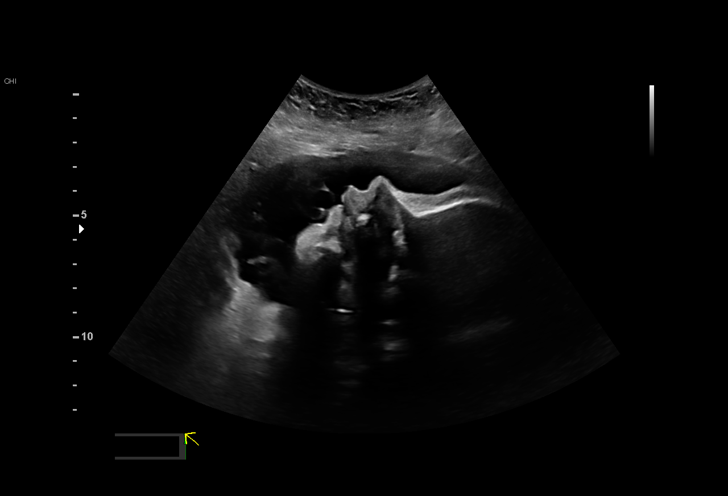
[im 45/45]
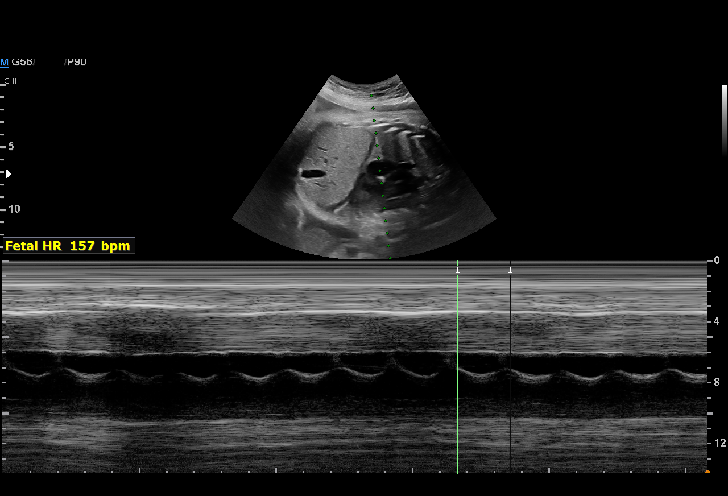

[15 of 28 positions shown; findings below may reference images not displayed]

OBGYN
                                                            [REDACTED]
                   PROCH[facility]

Indications

 Hypertension - Chronic/Pre-existing
 (procardia)
 Polyhydramnios, third trimester, antepartum
 condition or complication, unspecified fetus
 Advanced maternal age primigravida 35+,
 third trimester
 Poor obstetric history: Previous
 preeclampsia / eclampsia/gestational HTN
 Subchorionic hemorrhage, antepartum
 Low risk NIPS, neg AFP
 37 weeks gestation of pregnancy
Fetal Evaluation

 Num Of Fetuses:         1
 Fetal Heart Rate(bpm):  157
 Cardiac Activity:       Observed
 Presentation:           Cephalic
 Placenta:               Posterior Fundal
 P. Cord Insertion:      Previously Visualized

 Amniotic Fluid
 AFI FV:      Subjectively upper-normal

 AFI Sum(cm)     %Tile       Largest Pocket(cm)
 22.09           87
 RUQ(cm)       RLQ(cm)       LUQ(cm)        LLQ(cm)

Biophysical Evaluation

 Amniotic F.V:   Pocket => 2 cm             F. Tone:        Observed
 F. Movement:    Observed                   Score:          [DATE]
 F. Breathing:   Observed
Biometry

 LV:        5.7  mm
OB History

 Gravidity:    1
 Living:       0
Gestational Age

 LMP:           37w 4d        Date:  10/25/20                 EDD:   08/01/21
 Best:          37w 4d     Det. By:  LMP  (10/25/20)          EDD:   08/01/21
Anatomy

 Ventricles:            Appears normal         Kidneys:                Appear normal
 Heart:                 Appears normal         Bladder:                Appears normal
                        (4CH, axis, and
                        situs)
 Stomach:               Appears normal, left
                        sided
Cervix Uterus Adnexa

 Cervix
 Not visualized (advanced GA >40wks)
Comments

 This patient was seen for a biophysical profile due to chronic
 hypertension treated with Procardia and advanced maternal
 age.
 A biophysical profile performed today was [DATE].
 There was normal amniotic fluid noted on today's ultrasound
 exam.
 She is already scheduled for delivery on [DATE],

 She should have an NST performed in your office next week.

## 2022-03-30 ENCOUNTER — Encounter: Payer: Self-pay | Admitting: Cardiology

## 2022-03-30 ENCOUNTER — Telehealth: Payer: Self-pay

## 2022-03-30 ENCOUNTER — Ambulatory Visit (INDEPENDENT_AMBULATORY_CARE_PROVIDER_SITE_OTHER): Payer: BC Managed Care – PPO | Admitting: Cardiology

## 2022-03-30 VITALS — BP 130/90 | HR 86 | Ht 68.0 in | Wt 181.2 lb

## 2022-03-30 DIAGNOSIS — Z79899 Other long term (current) drug therapy: Secondary | ICD-10-CM | POA: Diagnosis not present

## 2022-03-30 DIAGNOSIS — I10 Essential (primary) hypertension: Secondary | ICD-10-CM | POA: Diagnosis not present

## 2022-03-30 MED ORDER — CARVEDILOL 12.5 MG PO TABS: 12.5000 mg | ORAL_TABLET | Freq: Two times a day (BID) | ORAL | 3 refills | Status: DC

## 2022-03-30 NOTE — Progress Notes (Signed)
[facility]io[facility]s [facility]  Follow Up Note   Date:  03/30/2022   ID:  Alicia DessertMary Ribera, DOB 05-07-1986, MRN [id number]  PCP:  Janeece AgeeMorrow, Richard, NP   [facility] Providers Cardiologist:  Thomasene RippleKardie Prospero Mahnke, DO  Electrophysiologist:  None        Referring MD: Janeece AgeeMorrow, Richard, NP   Chief Complaint: " I am having some tingling with dry mouth"   History of Present Illness:    Alicia Nguyen is a 36 y.o. female [G2P2001] who returns for follow up for postpartum hypertension. She has a history of pre-eclampsia during her last pregnancy (delivered in August 2021). Also with a history of HTN, for which she took antihypertensives from 2014-2018, but this reportedly resolved after she lost a significant amount of weight.    At her initial cardio-ob visit on 03/15/21 her BP was 110/72.  Therefore no medication changes were made.   At her visit on June 05, 2019 she told me that she had been experiencing elevated blood pressure.  And this was concerning.  During that visit her blood pressure was elevated I therefore started the patient on nifedipine 30 mg daily.  In the interim her blood pressure still remain elevated so her OB/GYN increase her nifedipine to 60 mg daily.  She had lightheadedness with the full dose.   I saw the patient on August 03, 2021 at that time she had delivered and was postpartum.  During that visit her blood pressure was averaging in the 150s to 120.  She was on nifedipine 30 mg daily.  I did not change her medication.  She was doing well on the current dose of antihypertensive medication.   At her visit on October 15, 2021 she still was experiencing gestational hypertension.  We stopped the nifedipine and started valsartan.  Transvaginally the patient did not tolerate this medication she will therefore valsartan was stopped.  She has been on beta-blocker recommendation for making was to start carvedilol however the patient preferred Lopressor since she has been in the past.  In the  interim she called because her blood pressure was not well controlled.  I added hydrochlorothiazide.    The patient was continued 2023 at that time we switched her from Lopressor to coreg.   Prior CV Studies Reviewed: The following studies were reviewed today:  TTE IMPRESSIONS 02/03/2021  1. Left ventricular ejection fraction, by estimation, is 60 to 65%. The left ventricle has normal function. The left ventricle has no regional wall motion abnormalities. Left ventricular diastolic parameters were normal.   2. Right ventricular systolic function is normal. The right ventricular size is normal.   3. The mitral valve is normal in structure. No evidence of mitral valve regurgitation. No evidence of mitral stenosis.   4. The aortic valve is normal in structure. Aortic valve regurgitation is not visualized. No aortic stenosis is present.   5. The inferior vena cava is normal in size with greater than 50% respiratory variability, suggesting right atrial pressure of 3 mmHg.   FINDINGS   Left Ventricle: Left ventricular ejection fraction, by estimation, is 60 to 65%. The left ventricle has normal function. The left ventricle has no regional wall motion abnormalities. The left ventricular internal cavity size was normal in size. There is   no left ventricular hypertrophy. Left ventricular diastolic parameters  were normal.   Right Ventricle: The right ventricular size is normal. No increase in right ventricular wall thickness. Right ventricular systolic function is normal.   Left Atrium: Left atrial size  was normal in size.   Right Atrium: Right atrial size was normal in size.   Pericardium: There is no evidence of pericardial effusion.   Mitral Valve: The mitral valve is normal in structure. No evidence of mitral valve regurgitation. No evidence of mitral valve stenosis.   Tricuspid Valve: The tricuspid valve is normal in structure. Tricuspid valve regurgitation is not demonstrated. No evidence  of tricuspid stenosis.   Aortic Valve: The aortic valve is normal in structure. Aortic valve regurgitation is not visualized. No aortic stenosis is present.   Pulmonic Valve: The pulmonic valve was normal in structure. Pulmonic valve regurgitation is not visualized. No evidence of pulmonic stenosis.   Aorta: The aortic root is normal in size and structure.   Venous: The inferior vena cava is normal in size with greater than 50% respiratory variability, suggesting right atrial pressure of 3 mmHg.   IAS/Shunts: No atrial level shunt detected by color flow Doppler.      [place] of lower extremities 02/15/2021 Examination Guidelines:  A complete evaluation includes B-mode imaging, spectral Doppler, color  Doppler,  and power Doppler as needed of all accessible portions of each vessel.  Bilateral  testing is considered an integral part of a complete examination. Limited  examinations for reoccurring indications may be performed as noted. The  reflux  portion of the exam is performed with the patient in reverse  Trendelenburg.     Past Medical History:  Diagnosis Date   Anxiety 2010   Depression 11/14/2021   Post partum depression   Hypertension    Preeclampsia    Pregnancy induced hypertension    Pregnant     Past Surgical History:  Procedure Laterality Date   BRAIN SURGERY  1989   Optic glioma removal   EYE SURGERY     Strabismus right eye   FOOT FRACTURE SURGERY     Optic gioma removal     REFRACTIVE SURGERY        OB History     Gravida  2   Para  2   Term  2   Preterm  0   AB  0   Living  1      SAB  0   IAB  0   Ectopic  0   Multiple  0   Live Births  1               Current Medications: Current Meds  Medication Sig   carvedilol (COREG) 12.5 MG tablet Take 1 tablet (12.5 mg total) by mouth 2 (two) times daily.   sertraline (ZOLOFT) 50 MG tablet Take 50 mg by mouth daily.   [DISCONTINUED] carvedilol (COREG) 6.25 MG tablet Take 1 tablet  (6.25 mg total) by mouth 2 (two) times daily.   [DISCONTINUED] hydrochlorothiazide (MICROZIDE) 12.5 MG capsule Take 1 capsule (12.5 mg total) by mouth daily.     Allergies:   Patient has no known allergies.   Social History   Socioeconomic History   Marital status: Married    Spouse name: Not on file   Number of children: 2   Years of education: Not on file   Highest education level: Not on file  Occupational History   Not on file  Tobacco Use   Smoking status: Never   Smokeless tobacco: Never  Vaping Use   Vaping Use: Never used  Substance and Sexual Activity   Alcohol use: Not Currently   Drug use: Never   Sexual activity: Yes  Birth control/protection: Pill  Other Topics Concern   Not on file  Social History Narrative   Not on file   Social Determinants of Health   Financial Resource Strain: Not on file  Food Insecurity: Not on file  Transportation Needs: Not on file  Physical Activity: Not on file  Stress: Not on file  Social Connections: Not on file      Family History  Problem Relation Age of Onset   Diabetes Mother    Hypertension Mother    Miscarriages / [place] Mother    Skin cancer Father    Colon polyps Father    Hypertension Father    Diabetes Maternal Grandfather    Skin cancer Maternal Grandfather    Diabetes Paternal Grandmother    CVA Paternal Grandmother    Dementia Maternal Aunt    Cervical cancer Paternal Aunt    Colon cancer Neg Hx    Liver disease Neg Hx    Esophageal cancer Neg Hx    Stomach cancer Neg Hx       ROS:   Please see the history of present illness.     All other systems reviewed and are negative.   Labs/EKG Reviewed:    EKG:   EKG is was not  ordered today.   Recent Labs: 07/17/2021: ALT 13 07/25/2021: Hemoglobin 9.1; Platelets 242 12/06/2021: BUN 11; Creatinine, Ser 0.89; Magnesium 2.0; Potassium 4.6; Sodium 142   Recent Lipid Panel No results found for: CHOL, TRIG, HDL, CHOLHDL, LDLCALC,  LDLDIRECT  Physical Exam:    VS:  BP 130/90   Pulse 86   Ht 5\' 8"  (1.727 m)   Wt 181 lb 3.2 oz (82.2 kg)   LMP 03/24/2022   SpO2 98%   Breastfeeding No   BMI 27.55 kg/m     Wt Readings from Last 3 Encounters:  03/30/22 181 lb 3.2 oz (82.2 kg)  01/31/22 170 lb (77.1 kg)  01/10/22 174 lb (78.9 kg)     GEN:  Well nourished, well developed in no acute distress HEENT: Normal NECK: No JVD; No carotid bruits LYMPHATICS: No lymphadenopathy CARDIAC: RRR, no murmurs, rubs, gallops RESPIRATORY:  Clear to auscultation without rales, wheezing or rhonchi  ABDOMEN: Soft, non-tender, non-distended MUSCULOSKELETAL:  No edema; No deformity  SKIN: Warm and dry NEUROLOGIC:  Alert and oriented x 3 PSYCHIATRIC:  Normal affect    Risk Assessment/Risk Calculators:                 ASSESSMENT & PLAN:    Hypertension-she tells me her blood pressure at home is under control.  But the biggest problem is the fact that she is having significant tingling and dry mouth with the hydrochlorothiazide.  We will stop that medication.  But increase her Coreg to 12.5 mg daily.   With her symptoms we will get blood work which include vitamin D level, B12 as well as TSH.  The patient is in agreement with the above plan. The patient left the office in stable condition.  The patient will follow up in 6 months  Patient Instructions  Medication Instructions:  Your physician has recommended you make the following change in your medication:  STOP: Hydrochlorothiazide START: Coreg 12.5 mg twice daily *If you need a refill on your cardiac medications before your next appointment, please call your pharmacy*   Lab Work: Your physician recommends that you return for lab work in:  TODAY: Vitamin D, Vitamin B12, TSH+T4+T3 If you have labs (blood work) drawn today  and your tests are completely normal, you will receive your results only by: MyChart Message (if you have MyChart) OR A paper copy in the mail If  you have any lab test that is abnormal or we need to change your treatment, we will call you to review the results.   Testing/Procedures: None   Follow-Up: At [facility], you and your health needs are our priority.  As part of our continuing mission to provide you with exceptional heart care, we have created designated Provider Care Teams.  These Care Teams include your primary Cardiologist (physician) and Advanced Practice Providers (APPs -  Physician Assistants and Nurse Practitioners) who all work together to provide you with the care you need, when you need it.  We recommend signing up for the patient portal called "MyChart".  Sign up information is provided on this After Visit Summary.  MyChart is used to connect with patients for Virtual Visits (Telemedicine).  Patients are able to view lab/test results, encounter notes, upcoming appointments, etc.  Non-urgent messages can be sent to your provider as well.   To learn more about what you can do with MyChart, go to [email or web address].    Your next appointment:   6 month(s)  The format for your next appointment:   In Person  Provider:   Thomasene Ripple, DO     Other Instructions   Important Information About Sugar         Dispo:  Return in about 6 months (around 09/29/2022).   Medication Adjustments/Labs and Tests Ordered: Current medicines are reviewed at length with the patient today.  Concerns regarding medicines are outlined above.  Tests Ordered: Orders Placed This Encounter  Procedures   VITAMIN D 25 Hydroxy (Vit-D Deficiency, Fractures)   Vitamin B12   TSH+T4F+T3Free   Medication Changes: Meds ordered this encounter  Medications   carvedilol (COREG) 12.5 MG tablet    Sig: Take 1 tablet (12.5 mg total) by mouth 2 (two) times daily.    Dispense:  180 tablet    Refill:  3

## 2022-03-30 NOTE — Patient Instructions (Signed)
Medication Instructions:  Your physician has recommended you make the following change in your medication:  STOP: Hydrochlorothiazide START: Coreg 12.5 mg twice daily *If you need a refill on your cardiac medications before your next appointment, please call your pharmacy*   Lab Work: Your physician recommends that you return for lab work in:  TODAY: Vitamin D, Vitamin B12, TSH+T4+T3 If you have labs (blood work) drawn today and your tests are completely normal, you will receive your results only by: MyChart Message (if you have MyChart) OR A paper copy in the mail If you have any lab test that is abnormal or we need to change your treatment, we will call you to review the results.   Testing/Procedures: None   Follow-Up: At [facility], you and your health needs are our priority.  As part of our continuing mission to provide you with exceptional heart care, we have created designated Provider Care Teams.  These Care Teams include your primary Cardiologist (physician) and Advanced Practice Providers (APPs -  Physician Assistants and Nurse Practitioners) who all work together to provide you with the care you need, when you need it.  We recommend signing up for the patient portal called "MyChart".  Sign up information is provided on this After Visit Summary.  MyChart is used to connect with patients for Virtual Visits (Telemedicine).  Patients are able to view lab/test results, encounter notes, upcoming appointments, etc.  Non-urgent messages can be sent to your provider as well.   To learn more about what you can do with MyChart, go to [email or web address].    Your next appointment:   6 month(s)  The format for your next appointment:   In Person  Provider:   Thomasene Ripple, DO     Other Instructions   Important Information About Sugar

## 2022-03-30 NOTE — Telephone Encounter (Signed)
Called pt to see if she could come in sooner. No answer, left message for her to return the call.

## 2022-03-31 LAB — TSH+T4F+T3FREE
Free T4: 1.1 ng/dL (ref 0.82–1.77)
T3, Free: 2.9 pg/mL (ref 2.0–4.4)
TSH: 1.45 u[IU]/mL (ref 0.450–4.500)

## 2022-03-31 LAB — VITAMIN D 25 HYDROXY (VIT D DEFICIENCY, FRACTURES): Vit D, 25-Hydroxy: 30.5 ng/mL (ref 30.0–100.0)

## 2022-03-31 LAB — VITAMIN B12: Vitamin B-12: 487 pg/mL (ref 232–1245)

## 2022-04-25 ENCOUNTER — Encounter: Payer: Self-pay | Admitting: Registered Nurse

## 2022-04-25 ENCOUNTER — Other Ambulatory Visit: Payer: Self-pay | Admitting: Registered Nurse

## 2022-04-25 DIAGNOSIS — H9202 Otalgia, left ear: Secondary | ICD-10-CM

## 2022-04-25 DIAGNOSIS — M26622 Arthralgia of left temporomandibular joint: Secondary | ICD-10-CM

## 2022-04-26 DIAGNOSIS — N750 Cyst of Bartholin's gland: Secondary | ICD-10-CM | POA: Diagnosis not present

## 2022-04-26 DIAGNOSIS — Z6827 Body mass index (BMI) 27.0-27.9, adult: Secondary | ICD-10-CM | POA: Diagnosis not present

## 2022-05-30 ENCOUNTER — Ambulatory Visit (INDEPENDENT_AMBULATORY_CARE_PROVIDER_SITE_OTHER): Payer: BC Managed Care – PPO | Admitting: Family

## 2022-05-30 ENCOUNTER — Encounter: Payer: Self-pay | Admitting: Family

## 2022-05-30 VITALS — BP 139/98 | HR 89 | Temp 98.7°F | Ht 68.0 in | Wt 181.4 lb

## 2022-05-30 DIAGNOSIS — R519 Headache, unspecified: Secondary | ICD-10-CM | POA: Diagnosis not present

## 2022-05-30 DIAGNOSIS — F53 Postpartum depression: Secondary | ICD-10-CM | POA: Insufficient documentation

## 2022-05-30 HISTORY — DX: Postpartum depression: F53.0

## 2022-05-30 MED ORDER — NAPROXEN 500 MG PO TABS: 500.0000 mg | ORAL_TABLET | Freq: Two times a day (BID) | ORAL | 0 refills | Status: DC

## 2022-05-30 MED ORDER — NAPROXEN 500 MG PO TABS: 500.0000 mg | ORAL_TABLET | Freq: Three times a day (TID) | ORAL | 0 refills | Status: DC

## 2022-05-30 NOTE — Patient Instructions (Signed)
Welcome to [facility], It was a pleasure meeting you today!    As discussed, I have sent the Naproxen to your pharmacy to help your pain.  Do not take Advil, Ibuprofen, or Aleve with this. OK to take Tylenol. If needed, you can take the Naproxen up to 3 times per day, 6 hours apart.   I have sent the order for a CT scan to the [facility] on [address], they will call you directly to schedule.  Call the ENT office about your referral.      PLEASE NOTE: If you had any LAB tests please let us know if you have not heard back within a few days. You may see your results on MyChart before we have a chance to review them but we will give you a call once they are reviewed by [place]. If we ordered any REFERRALS today, please let us know if you have not heard from their office within the next week.  Let us know through MyChart if you are needing REFILLS, or have your pharmacy send [place] the request. You can also use MyChart to communicate with me or any office staff.

## 2022-05-30 NOTE — Progress Notes (Signed)
Patient ID: Alicia Nguyen, female    DOB: 1986-05-18, 36 y.o.   MRN: [id number]  Chief Complaint  Patient presents with   Transfer of care   Ear Pain    Pt c/o dull/aching pain in left ear and down to neck. Worse if she bends her neck or pull her ear. Pt has had antibiotics in the past, prednisone and muscle relaxer which did help but came back in June.     HPI: Ear pain - pt describes a 3 mos hx of left sided facial and ear pain. She received AMOX, prednisone and hydrocortisone-acetic ear drops in April, pain improved, the pain returned in June and PCP sent ENT referral, which is in the system, but pt has not heard anything. Reports feeling the pain when she turns her neck sideways to the right or when she pulls on her ear lobe,  she has a baby & a toddler, and she is left handed, denies any neuropathy sx. Taking 800mg  of Ibuprofen with some relief, but afraid to take more, pt is not breastfeeding.  Assessment & Plan:   Problem List Items Addressed This Visit   None Visit Diagnoses     Left facial pain    -  Primary started a few months ago, pain with lateral movement of head to right shoulder and if she tugs her left ear. She is left handed and lifts a baby and toddler all day long. Unsure if pinched nerve - denies paresthesias, just sharp pain. sending Naproxen, advised on use & SE, do not continue Ibuprofen or Aleve, ok to take Tylenol prn, or alternate. Ordering CT scan to further assess. Pt has ENT ordered by previous ENT.     Relevant Medications   naproxen (NAPROSYN) 500 MG tablet   Other Relevant Orders   CT Maxillofacial WO CM         Subjective:    Outpatient Medications Prior to Visit  Medication Sig Dispense Refill   carvedilol (COREG) 12.5 MG tablet Take 1 tablet (12.5 mg total) by mouth 2 (two) times daily. 180 tablet 3   cyclobenzaprine (FLEXERIL) 5 MG tablet TAKE 1 TABLET BY MOUTH THREE TIMES A DAY AS NEEDED FOR MUSCLE SPASMS 30 tablet 1   sertraline (ZOLOFT) 50 MG  tablet Take 50 mg by mouth daily.     No facility-administered medications prior to visit.   Past Medical History:  Diagnosis Date   Anxiety 2010   Depression 11/14/2021   Post partum depression   Hypertension    Preeclampsia    Pregnancy induced hypertension    Pregnant    Past Surgical History:  Procedure Laterality Date   BRAIN SURGERY  1989   Optic glioma removal   EYE SURGERY     Strabismus right eye   FOOT FRACTURE SURGERY     Optic gioma removal     REFRACTIVE SURGERY     No Known Allergies    Objective:    Physical Exam Vitals and nursing note reviewed.  Constitutional:      Appearance: Normal appearance.  HENT:     Head: No left periorbital erythema.     Jaw: Tenderness present. No swelling.     Right Ear: Tympanic membrane and ear canal normal.     Left Ear: Tympanic membrane and ear canal normal.     Nose:     Left Sinus: Maxillary sinus tenderness and frontal sinus tenderness present.     Mouth/Throat:     Mouth: Mucous  membranes are moist.     Pharynx: No pharyngeal swelling, oropharyngeal exudate, posterior oropharyngeal erythema or uvula swelling.  Cardiovascular:     Rate and Rhythm: Normal rate and regular rhythm.  Pulmonary:     Effort: Pulmonary effort is normal.     Breath sounds: Normal breath sounds.  Musculoskeletal:        General: Normal range of motion.  Skin:    General: Skin is warm and dry.  Neurological:     Mental Status: She is alert.  Psychiatric:        Mood and Affect: Mood normal.        Behavior: Behavior normal.    BP (!) 139/98 (BP Location: Left Arm, Patient Position: Sitting, Cuff Size: Large)   Pulse 89   Temp 98.7 F (37.1 C) (Temporal)   Ht 5\' 8"  (1.727 m)   Wt 181 lb 6 oz (82.3 kg)   LMP 05/26/2022 (Exact Date)   SpO2 99%   Breastfeeding No   BMI 27.58 kg/m  Wt Readings from Last 3 Encounters:  05/30/22 181 lb 6 oz (82.3 kg)  03/30/22 181 lb 3.2 oz (82.2 kg)  01/31/22 170 lb (77.1 kg)        04/02/22, NP

## 2022-06-09 ENCOUNTER — Ambulatory Visit (HOSPITAL_BASED_OUTPATIENT_CLINIC_OR_DEPARTMENT_OTHER)
Admission: RE | Admit: 2022-06-09 | Discharge: 2022-06-09 | Disposition: A | Discharge status: Home / Self Care | Payer: BC Managed Care – PPO | Source: Ambulatory Visit | Attending: Family | Admitting: Family

## 2022-06-09 DIAGNOSIS — R519 Headache, unspecified: Secondary | ICD-10-CM | POA: Diagnosis not present

## 2022-06-10 ENCOUNTER — Other Ambulatory Visit: Payer: Self-pay | Admitting: Family

## 2022-06-10 DIAGNOSIS — R519 Headache, unspecified: Secondary | ICD-10-CM

## 2022-06-11 NOTE — Progress Notes (Signed)
As you can see your CT scan appears negative for any anomaly. Let your ENT provider know this was done and if they are unable to pull up in Epic, we can provide a copy.

## 2022-07-11 DIAGNOSIS — M542 Cervicalgia: Secondary | ICD-10-CM | POA: Diagnosis not present

## 2022-07-11 DIAGNOSIS — H93293 Other abnormal auditory perceptions, bilateral: Secondary | ICD-10-CM | POA: Diagnosis not present

## 2022-07-11 DIAGNOSIS — H9202 Otalgia, left ear: Secondary | ICD-10-CM | POA: Diagnosis not present

## 2022-07-12 DIAGNOSIS — H93292 Other abnormal auditory perceptions, left ear: Secondary | ICD-10-CM | POA: Diagnosis not present

## 2022-07-20 DIAGNOSIS — D1801 Hemangioma of skin and subcutaneous tissue: Secondary | ICD-10-CM | POA: Diagnosis not present

## 2022-07-20 DIAGNOSIS — L821 Other seborrheic keratosis: Secondary | ICD-10-CM | POA: Diagnosis not present

## 2022-07-20 DIAGNOSIS — L578 Other skin changes due to chronic exposure to nonionizing radiation: Secondary | ICD-10-CM | POA: Diagnosis not present

## 2022-07-20 DIAGNOSIS — L814 Other melanin hyperpigmentation: Secondary | ICD-10-CM | POA: Diagnosis not present

## 2022-07-25 ENCOUNTER — Encounter: Payer: Self-pay | Admitting: *Deleted

## 2022-08-03 ENCOUNTER — Non-Acute Institutional Stay (HOSPITAL_COMMUNITY)
Admission: RE | Admit: 2022-08-03 | Discharge: 2022-08-03 | Disposition: A | Discharge status: Home / Self Care | Payer: BC Managed Care – PPO | Source: Ambulatory Visit | Attending: Internal Medicine | Admitting: Internal Medicine

## 2022-08-03 DIAGNOSIS — O210 Mild hyperemesis gravidarum: Secondary | ICD-10-CM | POA: Insufficient documentation

## 2022-08-03 MED ORDER — LACTATED RINGERS IV BOLUS
2000.0000 mL | Freq: Once | INTRAVENOUS | Status: AC
  Administered 2022-08-03: 2000 mL via INTRAVENOUS

## 2022-08-03 NOTE — Progress Notes (Signed)
Patient received 2000 cc of Lactated ringers solution (1/5) via PIV, as ordered by Irene Pap, MD for hyperemesis gravidarum. Tolerated well, vitals stable, declined AVS.  Patient alert, oriented and ambulatory at the time of discharge.

## 2022-08-11 DIAGNOSIS — Z3201 Encounter for pregnancy test, result positive: Secondary | ICD-10-CM | POA: Diagnosis not present

## 2022-08-11 DIAGNOSIS — Z113 Encounter for screening for infections with a predominantly sexual mode of transmission: Secondary | ICD-10-CM | POA: Diagnosis not present

## 2022-08-11 DIAGNOSIS — N925 Other specified irregular menstruation: Secondary | ICD-10-CM | POA: Diagnosis not present

## 2022-08-11 DIAGNOSIS — Z3480 Encounter for supervision of other normal pregnancy, unspecified trimester: Secondary | ICD-10-CM | POA: Diagnosis not present

## 2022-08-11 LAB — OB RESULTS CONSOLE GC/CHLAMYDIA
Chlamydia: NEGATIVE
Neisseria Gonorrhea: NEGATIVE

## 2022-08-16 ENCOUNTER — Other Ambulatory Visit: Payer: Self-pay | Admitting: Obstetrics

## 2022-08-16 DIAGNOSIS — N6320 Unspecified lump in the left breast, unspecified quadrant: Secondary | ICD-10-CM

## 2022-08-23 ENCOUNTER — Ambulatory Visit
Admission: RE | Admit: 2022-08-23 | Discharge: 2022-08-23 | Disposition: A | Discharge status: Home / Self Care | Payer: BC Managed Care – PPO | Source: Ambulatory Visit | Attending: Obstetrics | Admitting: Obstetrics

## 2022-08-23 ENCOUNTER — Other Ambulatory Visit: Payer: Self-pay | Admitting: Obstetrics

## 2022-08-23 DIAGNOSIS — N6320 Unspecified lump in the left breast, unspecified quadrant: Secondary | ICD-10-CM

## 2022-08-23 DIAGNOSIS — R92343 Mammographic extreme density, bilateral breasts: Secondary | ICD-10-CM | POA: Diagnosis not present

## 2022-08-24 ENCOUNTER — Encounter (HOSPITAL_COMMUNITY): Payer: BC Managed Care – PPO

## 2022-08-31 ENCOUNTER — Encounter: Payer: BC Managed Care – PPO | Admitting: Registered Nurse

## 2022-09-08 DIAGNOSIS — Z31430 Encounter of female for testing for genetic disease carrier status for procreative management: Secondary | ICD-10-CM | POA: Diagnosis not present

## 2022-09-08 DIAGNOSIS — Z369 Encounter for antenatal screening, unspecified: Secondary | ICD-10-CM | POA: Diagnosis not present

## 2022-09-08 DIAGNOSIS — O09521 Supervision of elderly multigravida, first trimester: Secondary | ICD-10-CM | POA: Diagnosis not present

## 2022-09-08 DIAGNOSIS — Z3480 Encounter for supervision of other normal pregnancy, unspecified trimester: Secondary | ICD-10-CM | POA: Diagnosis not present

## 2022-09-08 LAB — OB RESULTS CONSOLE ANTIBODY SCREEN: Antibody Screen: NEGATIVE

## 2022-09-08 LAB — OB RESULTS CONSOLE RUBELLA ANTIBODY, IGM: Rubella: IMMUNE

## 2022-09-08 LAB — HEPATITIS C ANTIBODY: HCV Ab: NEGATIVE

## 2022-09-08 LAB — OB RESULTS CONSOLE HEPATITIS B SURFACE ANTIGEN: Hepatitis B Surface Ag: NEGATIVE

## 2022-09-08 LAB — OB RESULTS CONSOLE HIV ANTIBODY (ROUTINE TESTING): HIV: NONREACTIVE

## 2022-09-08 LAB — OB RESULTS CONSOLE RPR: RPR: NONREACTIVE

## 2022-09-29 ENCOUNTER — Ambulatory Visit: Payer: BC Managed Care – PPO | Attending: Cardiology | Admitting: Cardiology

## 2022-09-29 ENCOUNTER — Encounter: Payer: Self-pay | Admitting: Cardiology

## 2022-09-29 VITALS — BP 122/80 | HR 99 | Ht 68.0 in | Wt 181.2 lb

## 2022-09-29 DIAGNOSIS — I10 Essential (primary) hypertension: Secondary | ICD-10-CM | POA: Diagnosis not present

## 2022-09-29 DIAGNOSIS — Z8759 Personal history of other complications of pregnancy, childbirth and the puerperium: Secondary | ICD-10-CM | POA: Diagnosis not present

## 2022-09-29 DIAGNOSIS — O09522 Supervision of elderly multigravida, second trimester: Secondary | ICD-10-CM

## 2022-09-29 DIAGNOSIS — O0991 Supervision of high risk pregnancy, unspecified, first trimester: Secondary | ICD-10-CM | POA: Diagnosis not present

## 2022-09-29 NOTE — Progress Notes (Signed)
Cardio-Obstetrics Clinic  Follow Up Note   Date:  09/29/2022   ID:  Alicia Nguyen, DOB 08/18/1986, MRN [id number]  PCP:  Dulce Sellar, NP   [facility] Providers Cardiologist:  Thomasene Ripple, DO  Electrophysiologist:  None        Referring MD: Janeece Agee, NP   Chief Complaint: "I am now   History of Present Illness:    Alicia Nguyen is a 36 y.o. female [G3P2001] who returns for follow up for chronic hypertension in pregnancy.  She has a history of pre-eclampsia during her last pregnancy (delivered in August 2021). Also with a history of HTN, for which she took antihypertensives from 2014-2018, but this reportedly resolved after she lost a significant amount of weight.    At her initial cardio-ob visit on 03/15/21 her BP was 110/72.  Therefore no medication changes were made.   At her visit on June 05, 2019 she told me that she had been experiencing elevated blood pressure.  And this was concerning.  During that visit her blood pressure was elevated I therefore started the patient on nifedipine 30 mg daily.  In the interim her blood pressure still remain elevated so her OB/GYN increase her nifedipine to 60 mg daily.  She had lightheadedness with the full dose.   I saw the patient on August 03, 2021 at that time she had delivered and was postpartum.  During that visit her blood pressure was averaging in the 150s to 120.  She was on nifedipine 30 mg daily.  I did not change her medication.  She was doing well on the current dose of antihypertensive medication.   At her visit on October 15, 2021 she still was experiencing gestational hypertension.  We stopped the nifedipine and started valsartan.  Transvaginally the patient did not tolerate this medication she will therefore valsartan was stopped.  She has been on beta-blocker recommendation for making was to start carvedilol however the patient preferred Lopressor since she has been in the past.  In the interim she  called because her blood pressure was not well controlled.  I added hydrochlorothiazide.  At her visit in May 2023 she had been switched from Lopressor to Coreg.  She is doing well.  She is now pregnant [redacted] weeks.  She is having significant nausea and vomiting but no other complaints.  Prior CV Studies Reviewed: The following studies were reviewed today: TTE IMPRESSIONS 02/03/2021  1. Left ventricular ejection fraction, by estimation, is 60 to 65%. The left ventricle has normal function. The left ventricle has no regional wall motion abnormalities. Left ventricular diastolic parameters were normal.   2. Right ventricular systolic function is normal. The right ventricular size is normal.   3. The mitral valve is normal in structure. No evidence of mitral valve regurgitation. No evidence of mitral stenosis.   4. The aortic valve is normal in structure. Aortic valve regurgitation is not visualized. No aortic stenosis is present.   5. The inferior vena cava is normal in size with greater than 50% respiratory variability, suggesting right atrial pressure of 3 mmHg.   FINDINGS   Left Ventricle: Left ventricular ejection fraction, by estimation, is 60 to 65%. The left ventricle has normal function. The left ventricle has no regional wall motion abnormalities. The left ventricular internal cavity size was normal in size. There is   no left ventricular hypertrophy. Left ventricular diastolic parameters  were normal.   Right Ventricle: The right ventricular size is normal. No increase in  right ventricular wall thickness. Right ventricular systolic function is normal.   Left Atrium: Left atrial size was normal in size.   Right Atrium: Right atrial size was normal in size.   Pericardium: There is no evidence of pericardial effusion.   Mitral Valve: The mitral valve is normal in structure. No evidence of mitral valve regurgitation. No evidence of mitral valve stenosis.   Tricuspid Valve: The tricuspid  valve is normal in structure. Tricuspid valve regurgitation is not demonstrated. No evidence of tricuspid stenosis.   Aortic Valve: The aortic valve is normal in structure. Aortic valve regurgitation is not visualized. No aortic stenosis is present.   Pulmonic Valve: The pulmonic valve was normal in structure. Pulmonic valve regurgitation is not visualized. No evidence of pulmonic stenosis.   Aorta: The aortic root is normal in size and structure.   Venous: The inferior vena cava is normal in size with greater than 50% respiratory variability, suggesting right atrial pressure of 3 mmHg.   IAS/Shunts: No atrial level shunt detected by color flow Doppler.      [place] of lower extremities 02/15/2021 Examination Guidelines:  A complete evaluation includes B-mode imaging, spectral Doppler, color  Doppler,  and power Doppler as needed of all accessible portions of each vessel.  Bilateral  testing is considered an integral part of a complete examination. Limited examinations for reoccurring indications may be performed as noted. The  reflux  portion of the exam is performed with the patient in reverse  Trendelenburg.    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   Gestational hypertension, third trimester 06/04/2021   History of pre-eclampsia 03/15/2021   Hypertension    Medication management 10/15/2021   Multigravida of advanced maternal age in third trimester 06/04/2021   Preeclampsia    Pregnancy 01/06/2021   Pregnancy induced hypertension    Pregnant     Past Surgical History:  Procedure Laterality Date   BRAIN SURGERY  1989   Optic glioma removal   EYE SURGERY     Strabismus right eye   FOOT FRACTURE SURGERY     Optic gioma  removal     REFRACTIVE SURGERY        OB History     Gravida  3   Para  2   Term  2   Preterm  0   AB  0   Living  1      SAB  0   IAB  0   Ectopic  0   Multiple  0   Live Births  1               Current Medications: Current Meds  Medication Sig   aspirin EC 81 MG tablet Take 81 mg by mouth daily. Swallow whole.   carvedilol (COREG) 12.5 MG tablet Take 1 tablet (12.5 mg total) by mouth 2 (two) times daily.   omeprazole (PRILOSEC) 20 MG capsule Take 20 mg by mouth daily.   ondansetron (ZOFRAN-ODT) 8 MG disintegrating tablet Take 8 mg by mouth 3 (three) times daily.   Prenatal Multivit-Min-Fe-FA (PRE-NATAL PO) Take by mouth.   promethazine (PHENERGAN) 25 MG tablet Take 25 mg by mouth every 6 (six) hours as needed.   sertraline (  ZOLOFT) 50 MG tablet Take 50 mg by mouth daily.     Allergies:   Patient has no known allergies.   Social History   Socioeconomic History   Marital status: Married    Spouse name: Not on file   Number of children: 2   Years of education: Not on file   Highest education level: Not on file  Occupational History   Not on file  Tobacco Use   Smoking status: Never   Smokeless tobacco: Never  Vaping Use   Vaping Use: Never used  Substance and Sexual Activity   Alcohol use: Not Currently   Drug use: Never   Sexual activity: Yes    Birth control/protection: Pill  Other Topics Concern   Not on file  Social History Narrative   Not on file   Social Determinants of Health   Financial Resource Strain: Not on file  Food Insecurity: Unknown (03/15/2021)   Hunger Vital Sign    Worried About Running Out of Food in the Last Year: Never true    Ran Out of Food in the Last Year: Not on file  Transportation Needs: Unknown (03/15/2021)   PRAPARE - Hydrologist (Medical): No    Lack of Transportation (Non-Medical): Not on file  Physical Activity: Not on file  Stress: Not on file  Social Connections: Not  on file      Family History  Problem Relation Age of Onset   Diabetes Mother    Hypertension Mother    Miscarriages / [place] Mother    Skin cancer Father    Colon polyps Father    Hypertension Father    Diabetes Maternal Grandfather    Skin cancer Maternal Grandfather    Diabetes Paternal Grandmother    CVA Paternal Grandmother    Dementia Maternal Aunt    Cervical cancer Paternal Aunt    Colon cancer Neg Hx    Liver disease Neg Hx    Esophageal cancer Neg Hx    Stomach cancer Neg Hx       ROS:   Please see the history of present illness.     All other systems reviewed and are negative.   Labs/EKG Reviewed:    EKG:   EKG is was not ordered today.    Recent Labs: 12/06/2021: BUN 11; Creatinine, Ser 0.89; Magnesium 2.0; Potassium 4.6; Sodium 142 03/30/2022: TSH 1.450   Recent Lipid Panel No results found for: "CHOL", "TRIG", "HDL", "CHOLHDL", "LDLCALC", "LDLDIRECT"  Physical Exam:    VS:  BP 122/80   Pulse 99   Ht 5\' 8"  (1.727 m)   Wt 181 lb 3.2 oz (82.2 kg)   LMP 06/21/2022   SpO2 100%   BMI 27.55 kg/m     Wt Readings from Last 3 Encounters:  09/29/22 181 lb 3.2 oz (82.2 kg)  05/30/22 181 lb 6 oz (82.3 kg)  03/30/22 181 lb 3.2 oz (82.2 kg)     GEN:  Well nourished, well developed in no acute distress HEENT: Normal NECK: No JVD; No carotid bruits LYMPHATICS: No lymphadenopathy CARDIAC: RRR, no murmurs, rubs, gallops RESPIRATORY:  Clear to auscultation without rales, wheezing or rhonchi  ABDOMEN: Soft, non-tender, non-distended MUSCULOSKELETAL:  No edema; No deformity  SKIN: Warm and dry NEUROLOGIC:  Alert and oriented x 3 PSYCHIATRIC:  Normal affect    Risk Assessment/Risk Calculators:     CARPREG II Risk Prediction Index Score:  1.  The patient's risk for a primary cardiac  event is 5%.            ASSESSMENT & PLAN:    Chronic hypertension pregnancy-her blood pressure is at target today we will continue the patient on the Coreg 12.5  mg twice daily.  The only problem will be is to monitor her closely with serial ultrasounds to make sure IUGR is not going to be a factor in this pregnancy with her slightly high dose of beta-blocker.  She understands that her blood pressure target is less than 140/90 mmHg.  She will send me information of her blood pressure every 2 weeks.  Continue aspirin for preeclampsia prophylaxis.  I am excited for the patient.   Patient Instructions  Medication Instructions:  Your physician recommends that you continue on your current medications as directed. Please refer to the Current Medication list given to you today.  *If you need a refill on your cardiac medications before your next appointment, please call your pharmacy*   Lab Work: NONE If you have labs (blood work) drawn today and your tests are completely normal, you will receive your results only by: [place] (if you have MyChart) OR A paper copy in the mail If you have any lab test that is abnormal or we need to change your treatment, we will call you to review the results.   Testing/Procedures: NONE   Follow-Up: At [facility], you and your health needs are our priority.  As part of our continuing mission to provide you with exceptional heart care, we have created designated Provider Care Teams.  These Care Teams include your primary Cardiologist (physician) and Advanced Practice Providers (APPs -  Physician Assistants and Nurse Practitioners) who all work together to provide you with the care you need, when you need it.  We recommend signing up for the patient portal called "MyChart".  Sign up information is provided on this After Visit Summary.  MyChart is used to connect with patients for Virtual Visits (Telemedicine).  Patients are able to view lab/test results, encounter notes, upcoming appointments, etc.  Non-urgent messages can be sent to your provider as well.   To learn more about what you can do with  MyChart, go to [email or web address].    Your next appointment:   10 week(s)  The format for your next appointment:   In Person  Provider:   Berniece Salines, DO     Dispo:  Return in about 10 weeks (around 12/08/2022).   Medication Adjustments/Labs and Tests Ordered: Current medicines are reviewed at length with the patient today.  Concerns regarding medicines are outlined above.  Tests Ordered: No orders of the defined types were placed in this encounter.  Medication Changes: No orders of the defined types were placed in this encounter.

## 2022-09-29 NOTE — Patient Instructions (Signed)
Medication Instructions:  Your physician recommends that you continue on your current medications as directed. Please refer to the Current Medication list given to you today.  *If you need a refill on your cardiac medications before your next appointment, please call your pharmacy*   Lab Work: NONE If you have labs (blood work) drawn today and your tests are completely normal, you will receive your results only by: MyChart Message (if you have MyChart) OR A paper copy in the mail If you have any lab test that is abnormal or we need to change your treatment, we will call you to review the results.   Testing/Procedures: NONE   Follow-Up: At [facility], you and your health needs are our priority.  As part of our continuing mission to provide you with exceptional heart care, we have created designated Provider Care Teams.  These Care Teams include your primary Cardiologist (physician) and Advanced Practice Providers (APPs -  Physician Assistants and Nurse Practitioners) who all work together to provide you with the care you need, when you need it.  We recommend signing up for the patient portal called "MyChart".  Sign up information is provided on this After Visit Summary.  MyChart is used to connect with patients for Virtual Visits (Telemedicine).  Patients are able to view lab/test results, encounter notes, upcoming appointments, etc.  Non-urgent messages can be sent to your provider as well.   To learn more about what you can do with MyChart, go to [email or web address].    Your next appointment:   10 week(s)  The format for your next appointment:   In Person  Provider:   Thomasene Ripple, DO

## 2022-10-05 DIAGNOSIS — Z3482 Encounter for supervision of other normal pregnancy, second trimester: Secondary | ICD-10-CM | POA: Diagnosis not present

## 2022-10-05 DIAGNOSIS — Z369 Encounter for antenatal screening, unspecified: Secondary | ICD-10-CM | POA: Diagnosis not present

## 2022-10-13 ENCOUNTER — Encounter: Payer: Self-pay | Admitting: *Deleted

## 2022-10-13 DIAGNOSIS — J101 Influenza due to other identified influenza virus with other respiratory manifestations: Secondary | ICD-10-CM | POA: Diagnosis not present

## 2022-10-13 DIAGNOSIS — Z03818 Encounter for observation for suspected exposure to other biological agents ruled out: Secondary | ICD-10-CM | POA: Diagnosis not present

## 2022-10-13 DIAGNOSIS — H9203 Otalgia, bilateral: Secondary | ICD-10-CM | POA: Diagnosis not present

## 2022-10-13 DIAGNOSIS — R051 Acute cough: Secondary | ICD-10-CM | POA: Diagnosis not present

## 2022-10-27 ENCOUNTER — Encounter: Payer: Self-pay | Admitting: Cardiology

## 2022-10-27 DIAGNOSIS — O99512 Diseases of the respiratory system complicating pregnancy, second trimester: Secondary | ICD-10-CM | POA: Diagnosis not present

## 2022-10-27 DIAGNOSIS — Z3A20 20 weeks gestation of pregnancy: Secondary | ICD-10-CM | POA: Diagnosis not present

## 2022-10-27 DIAGNOSIS — J019 Acute sinusitis, unspecified: Secondary | ICD-10-CM | POA: Diagnosis not present

## 2022-10-31 NOTE — L&D Delivery Note (Signed)
Delivery Note She progressed to complete and pushed with 3 ctx.  At 4:28 AM a viable female was delivered via Vaginal, Spontaneous (Presentation: Left Occiput Anterior).  APGAR: 9, 9; weight pending.   Placenta status: Spontaneous, Intact.  Cord: 3 vessels with the following complications: None.   Anesthesia: Epidural Episiotomy: None Lacerations: 1st degree Suture Repair: 3.0 vicryl rapide Est. Blood Loss (mL):  53  Mom to postpartum.  Baby to Couplet care / Skin to Skin.  They do not want baby circumcised  Zenaida Niece 03/17/2023, 4:50 AM

## 2022-11-07 DIAGNOSIS — Z369 Encounter for antenatal screening, unspecified: Secondary | ICD-10-CM | POA: Diagnosis not present

## 2022-11-08 ENCOUNTER — Encounter: Payer: Self-pay | Admitting: Obstetrics

## 2022-11-08 ENCOUNTER — Other Ambulatory Visit: Payer: Self-pay | Admitting: Obstetrics

## 2022-11-08 DIAGNOSIS — O09522 Supervision of elderly multigravida, second trimester: Secondary | ICD-10-CM

## 2022-11-11 ENCOUNTER — Ambulatory Visit: Payer: BC Managed Care – PPO | Attending: Obstetrics

## 2022-11-11 ENCOUNTER — Ambulatory Visit: Payer: BC Managed Care – PPO | Admitting: *Deleted

## 2022-11-11 ENCOUNTER — Other Ambulatory Visit: Payer: Self-pay | Admitting: *Deleted

## 2022-11-11 VITALS — BP 114/65 | HR 84

## 2022-11-11 DIAGNOSIS — O09899 Supervision of other high risk pregnancies, unspecified trimester: Secondary | ICD-10-CM

## 2022-11-11 DIAGNOSIS — O09522 Supervision of elderly multigravida, second trimester: Secondary | ICD-10-CM

## 2022-11-11 DIAGNOSIS — O4692 Antepartum hemorrhage, unspecified, second trimester: Secondary | ICD-10-CM

## 2022-11-11 DIAGNOSIS — O09299 Supervision of pregnancy with other poor reproductive or obstetric history, unspecified trimester: Secondary | ICD-10-CM

## 2022-11-11 DIAGNOSIS — O10012 Pre-existing essential hypertension complicating pregnancy, second trimester: Secondary | ICD-10-CM | POA: Diagnosis not present

## 2022-11-11 DIAGNOSIS — O09292 Supervision of pregnancy with other poor reproductive or obstetric history, second trimester: Secondary | ICD-10-CM

## 2022-11-11 DIAGNOSIS — O10912 Unspecified pre-existing hypertension complicating pregnancy, second trimester: Secondary | ICD-10-CM

## 2022-11-11 DIAGNOSIS — O285 Abnormal chromosomal and genetic finding on antenatal screening of mother: Secondary | ICD-10-CM

## 2022-11-11 DIAGNOSIS — Z3A21 21 weeks gestation of pregnancy: Secondary | ICD-10-CM

## 2022-11-11 DIAGNOSIS — Z148 Genetic carrier of other disease: Secondary | ICD-10-CM

## 2022-12-07 DIAGNOSIS — Z369 Encounter for antenatal screening, unspecified: Secondary | ICD-10-CM | POA: Diagnosis not present

## 2022-12-12 DIAGNOSIS — G4486 Cervicogenic headache: Secondary | ICD-10-CM | POA: Diagnosis not present

## 2022-12-12 DIAGNOSIS — M9901 Segmental and somatic dysfunction of cervical region: Secondary | ICD-10-CM | POA: Diagnosis not present

## 2022-12-12 DIAGNOSIS — M9905 Segmental and somatic dysfunction of pelvic region: Secondary | ICD-10-CM | POA: Diagnosis not present

## 2022-12-12 DIAGNOSIS — M542 Cervicalgia: Secondary | ICD-10-CM | POA: Diagnosis not present

## 2022-12-14 ENCOUNTER — Ambulatory Visit: Payer: BC Managed Care – PPO | Attending: Maternal & Fetal Medicine

## 2022-12-14 ENCOUNTER — Other Ambulatory Visit: Payer: Self-pay | Admitting: *Deleted

## 2022-12-14 ENCOUNTER — Ambulatory Visit: Payer: BC Managed Care – PPO | Admitting: *Deleted

## 2022-12-14 VITALS — BP 120/68 | HR 75

## 2022-12-14 DIAGNOSIS — Z362 Encounter for other antenatal screening follow-up: Secondary | ICD-10-CM | POA: Insufficient documentation

## 2022-12-14 DIAGNOSIS — O09292 Supervision of pregnancy with other poor reproductive or obstetric history, second trimester: Secondary | ICD-10-CM

## 2022-12-14 DIAGNOSIS — O09522 Supervision of elderly multigravida, second trimester: Secondary | ICD-10-CM

## 2022-12-14 DIAGNOSIS — O09899 Supervision of other high risk pregnancies, unspecified trimester: Secondary | ICD-10-CM | POA: Insufficient documentation

## 2022-12-14 DIAGNOSIS — O09299 Supervision of pregnancy with other poor reproductive or obstetric history, unspecified trimester: Secondary | ICD-10-CM | POA: Diagnosis not present

## 2022-12-14 DIAGNOSIS — O10912 Unspecified pre-existing hypertension complicating pregnancy, second trimester: Secondary | ICD-10-CM

## 2022-12-14 DIAGNOSIS — O4692 Antepartum hemorrhage, unspecified, second trimester: Secondary | ICD-10-CM | POA: Diagnosis not present

## 2022-12-14 DIAGNOSIS — Z148 Genetic carrier of other disease: Secondary | ICD-10-CM

## 2022-12-14 DIAGNOSIS — Z3A25 25 weeks gestation of pregnancy: Secondary | ICD-10-CM

## 2022-12-14 DIAGNOSIS — Z86018 Personal history of other benign neoplasm: Secondary | ICD-10-CM

## 2022-12-14 DIAGNOSIS — Z3689 Encounter for other specified antenatal screening: Secondary | ICD-10-CM

## 2022-12-14 DIAGNOSIS — O10012 Pre-existing essential hypertension complicating pregnancy, second trimester: Secondary | ICD-10-CM | POA: Diagnosis not present

## 2022-12-15 DIAGNOSIS — G4486 Cervicogenic headache: Secondary | ICD-10-CM | POA: Diagnosis not present

## 2022-12-15 DIAGNOSIS — M9905 Segmental and somatic dysfunction of pelvic region: Secondary | ICD-10-CM | POA: Diagnosis not present

## 2022-12-15 DIAGNOSIS — M9901 Segmental and somatic dysfunction of cervical region: Secondary | ICD-10-CM | POA: Diagnosis not present

## 2022-12-15 DIAGNOSIS — M542 Cervicalgia: Secondary | ICD-10-CM | POA: Diagnosis not present

## 2022-12-19 DIAGNOSIS — M9905 Segmental and somatic dysfunction of pelvic region: Secondary | ICD-10-CM | POA: Diagnosis not present

## 2022-12-19 DIAGNOSIS — G4486 Cervicogenic headache: Secondary | ICD-10-CM | POA: Diagnosis not present

## 2022-12-19 DIAGNOSIS — M9901 Segmental and somatic dysfunction of cervical region: Secondary | ICD-10-CM | POA: Diagnosis not present

## 2022-12-19 DIAGNOSIS — M542 Cervicalgia: Secondary | ICD-10-CM | POA: Diagnosis not present

## 2022-12-21 ENCOUNTER — Ambulatory Visit: Payer: BC Managed Care – PPO | Admitting: Cardiology

## 2022-12-21 DIAGNOSIS — M9905 Segmental and somatic dysfunction of pelvic region: Secondary | ICD-10-CM | POA: Diagnosis not present

## 2022-12-21 DIAGNOSIS — M542 Cervicalgia: Secondary | ICD-10-CM | POA: Diagnosis not present

## 2022-12-21 DIAGNOSIS — G4486 Cervicogenic headache: Secondary | ICD-10-CM | POA: Diagnosis not present

## 2022-12-21 DIAGNOSIS — M9901 Segmental and somatic dysfunction of cervical region: Secondary | ICD-10-CM | POA: Diagnosis not present

## 2022-12-26 DIAGNOSIS — M542 Cervicalgia: Secondary | ICD-10-CM | POA: Diagnosis not present

## 2022-12-26 DIAGNOSIS — M9905 Segmental and somatic dysfunction of pelvic region: Secondary | ICD-10-CM | POA: Diagnosis not present

## 2022-12-26 DIAGNOSIS — M9901 Segmental and somatic dysfunction of cervical region: Secondary | ICD-10-CM | POA: Diagnosis not present

## 2022-12-26 DIAGNOSIS — G4486 Cervicogenic headache: Secondary | ICD-10-CM | POA: Diagnosis not present

## 2022-12-27 ENCOUNTER — Encounter: Payer: Self-pay | Admitting: Cardiology

## 2022-12-27 ENCOUNTER — Ambulatory Visit: Payer: BC Managed Care – PPO | Attending: Cardiology | Admitting: Cardiology

## 2022-12-27 VITALS — BP 98/62 | HR 76 | Ht 68.0 in | Wt 188.4 lb

## 2022-12-27 DIAGNOSIS — O10919 Unspecified pre-existing hypertension complicating pregnancy, unspecified trimester: Secondary | ICD-10-CM

## 2022-12-27 DIAGNOSIS — O09522 Supervision of elderly multigravida, second trimester: Secondary | ICD-10-CM

## 2022-12-27 DIAGNOSIS — I10 Essential (primary) hypertension: Secondary | ICD-10-CM

## 2022-12-27 DIAGNOSIS — Z8759 Personal history of other complications of pregnancy, childbirth and the puerperium: Secondary | ICD-10-CM

## 2022-12-27 DIAGNOSIS — Z3A27 27 weeks gestation of pregnancy: Secondary | ICD-10-CM

## 2022-12-27 MED ORDER — CARVEDILOL 6.25 MG PO TABS: 6.2500 mg | ORAL_TABLET | Freq: Two times a day (BID) | ORAL | 3 refills | Status: DC

## 2022-12-27 NOTE — Patient Instructions (Addendum)
Medication Instructions:  Your physician has recommended you make the following change in your medication:  DECREASE: Coreg 6.25 mg twice daily *If you need a refill on your cardiac medications before your next appointment, please call your pharmacy*   Lab Work: None   Testing/Procedures: None   Follow-Up: At [facility], you and your health needs are our priority.  As part of our continuing mission to provide you with exceptional heart care, we have created designated Provider Care Teams.  These Care Teams include your primary Cardiologist (physician) and Advanced Practice Providers (APPs -  Physician Assistants and Nurse Practitioners) who all work together to provide you with the care you need, when you need it.   Your next appointment:   8 week(s) okay overbook  Provider:   Berniece Salines, DO

## 2022-12-27 NOTE — Progress Notes (Unsigned)
Cardio-Obstetrics Clinic  Follow Up Note   Date:  12/28/2022   ID:  Alicia Nguyen, DOB [birth year], MRN [id number]  PCP:  Jeanie Sewer, NP   [facility] Cardiologist:  Berniece Salines, DO  Electrophysiologist:  None        Referring MD: Jeanie Sewer, NP   Chief Complaint: "I am ok"  History of Present Illness:    Alicia Nguyen is a 37 y.o. female [G3P2002] who returns for follow up for chronic hypertension in pregnancy.  She has a history of pre-eclampsia during her last pregnancy (delivered in August 2021). Also with a history of HTN, for which she took antihypertensives from 2014-2018, but this reportedly resolved after she lost a significant amount of weight.    At her initial cardio-ob visit on 03/15/21 her BP was 110/72.  Therefore no medication changes were made.   At her visit on June 05, 2019 she told me that she had been experiencing elevated blood pressure.  And this was concerning.  During that visit her blood pressure was elevated I therefore started the patient on nifedipine 30 mg daily.  In the interim her blood pressure still remain elevated so her OB/GYN increase her nifedipine to 60 mg daily.  She had lightheadedness with the full dose.   I saw the patient on August 03, 2021 at that time she had delivered and was postpartum.  During that visit her blood pressure was averaging in the 150s to 120.  She was on nifedipine 30 mg daily.  I did not change her medication.  She was doing well on the current dose of antihypertensive medication.   At her visit on October 15, 2021 she still was experiencing gestational hypertension.  We stopped the nifedipine and started valsartan.  Transvaginally the patient did not tolerate this medication she will therefore valsartan was stopped.  She has been on beta-blocker recommendation for making was to start carvedilol however the patient preferred Lopressor since she has been in the past.  In the interim she  called because her blood pressure was not well controlled.  I added hydrochlorothiazide.  At her visit in May 2023 she had been switched from Lopressor to Bromide.  She is doing well.  She is now pregnant [redacted] weeks.  She is having significant nausea and vomiting but no other complaints.  At her last visit September 29, 2022, at that time no medication changes were made.    She is noticed lower blood pressure recently.  She is experiencing some dizziness from there.  Prior CV Studies Reviewed: The following studies were reviewed today: TTE IMPRESSIONS 02/03/2021  1. Left ventricular ejection fraction, by estimation, is 60 to 65%. The left ventricle has normal function. The left ventricle has no regional wall motion abnormalities. Left ventricular diastolic parameters were normal.   2. Right ventricular systolic function is normal. The right ventricular size is normal.   3. The mitral valve is normal in structure. No evidence of mitral valve regurgitation. No evidence of mitral stenosis.   4. The aortic valve is normal in structure. Aortic valve regurgitation is not visualized. No aortic stenosis is present.   5. The inferior vena cava is normal in size with greater than 50% respiratory variability, suggesting right atrial pressure of 3 mmHg.   FINDINGS   Left Ventricle: Left ventricular ejection fraction, by estimation, is 60 to 65%. The left ventricle has normal function. The left ventricle has no regional wall motion abnormalities. The left ventricular internal cavity  size was normal in size. There is   no left ventricular hypertrophy. Left ventricular diastolic parameters  were normal.   Right Ventricle: The right ventricular size is normal. No increase in right ventricular wall thickness. Right ventricular systolic function is normal.   Left Atrium: Left atrial size was normal in size.   Right Atrium: Right atrial size was normal in size.   Pericardium: There is no evidence of pericardial  effusion.   Mitral Valve: The mitral valve is normal in structure. No evidence of mitral valve regurgitation. No evidence of mitral valve stenosis.   Tricuspid Valve: The tricuspid valve is normal in structure. Tricuspid valve regurgitation is not demonstrated. No evidence of tricuspid stenosis.   Aortic Valve: The aortic valve is normal in structure. Aortic valve regurgitation is not visualized. No aortic stenosis is present.   Pulmonic Valve: The pulmonic valve was normal in structure. Pulmonic valve regurgitation is not visualized. No evidence of pulmonic stenosis.   Aorta: The aortic root is normal in size and structure.   Venous: The inferior vena cava is normal in size with greater than 50% respiratory variability, suggesting right atrial pressure of 3 mmHg.   IAS/Shunts: No atrial level shunt detected by color flow Doppler.      [place] of lower extremities 02/15/2021 Examination Guidelines:  A complete evaluation includes B-mode imaging, spectral Doppler, color  Doppler,  and power Doppler as needed of all accessible portions of each vessel.  Bilateral  testing is considered an integral part of a complete examination. Limited examinations for reoccurring indications may be performed as noted. The  reflux  portion of the exam is performed with the patient in reverse  Trendelenburg.    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   Gestational hypertension, third trimester 06/04/2021   History of pre-eclampsia 03/15/2021   Hypertension    Medication management 10/15/2021   Multigravida of advanced maternal age in third trimester 06/04/2021   Preeclampsia    Pregnancy 01/06/2021   Pregnancy induced hypertension    Pregnant      Past Surgical History:  Procedure Laterality Date   BRAIN SURGERY  1989   Optic glioma removal   EYE SURGERY     Strabismus right eye   FOOT FRACTURE SURGERY     Optic gioma removal     REFRACTIVE SURGERY        OB History     Gravida  3   Para  2   Term  2   Preterm  0   AB  0   Living  2      SAB  0   IAB  0   Ectopic  0   Multiple  0   Live Births  2               Current Medications: Current Meds  Medication Sig   aspirin EC 81 MG tablet Take 81 mg by mouth daily. Swallow whole.   carvedilol (COREG) 6.25 MG tablet Take 1 tablet (6.25 mg total) by mouth 2 (two) times daily.   omeprazole (PRILOSEC) 20 MG capsule Take 20 mg by mouth daily.   Prenatal Multivit-Min-Fe-FA (PRE-NATAL PO) Take by mouth.   promethazine (PHENERGAN) 25  MG tablet Take 25 mg by mouth every 6 (six) hours as needed.   sertraline (ZOLOFT) 50 MG tablet Take 50 mg by mouth daily.   [DISCONTINUED] carvedilol (COREG) 12.5 MG tablet Take 1 tablet (12.5 mg total) by mouth 2 (two) times daily.     Allergies:   Patient has no known allergies.   Social History   Socioeconomic History   Marital status: Married    Spouse name: Not on file   Number of children: 2   Years of education: Not on file   Highest education level: Not on file  Occupational History   Not on file  Tobacco Use   Smoking status: Never   Smokeless tobacco: Never  Vaping Use   Vaping Use: Never used  Substance and Sexual Activity   Alcohol use: Not Currently   Drug use: Never   Sexual activity: Yes    Birth control/protection: Pill  Other Topics Concern   Not on file  Social History Narrative   Not on file   Social Determinants of Health   Financial Resource Strain: Not on file  Food Insecurity: Unknown (03/15/2021)   Hunger Vital Sign    Worried About Running Out of Food in the Last Year: Never true    Ran Out of Food in the Last Year: Not on file  Transportation Needs: Unknown (03/15/2021)    PRAPARE - Hydrologist (Medical): No    Lack of Transportation (Non-Medical): Not on file  Physical Activity: Not on file  Stress: Not on file  Social Connections: Not on file      Family History  Problem Relation Age of Onset   Diabetes Mother    Hypertension Mother    Miscarriages / [place] Mother    Skin cancer Father    Colon polyps Father    Hypertension Father    Diabetes Maternal Grandfather    Skin cancer Maternal Grandfather    Diabetes Paternal Grandmother    CVA Paternal Grandmother    Dementia Maternal Aunt    Cervical cancer Paternal Aunt    Colon cancer Neg Hx    Liver disease Neg Hx    Esophageal cancer Neg Hx    Stomach cancer Neg Hx       ROS:   Please see the history of present illness.     All other systems reviewed and are negative.   Labs/EKG Reviewed:    EKG:   EKG is was not ordered today.    Recent Labs: 03/30/2022: TSH 1.450   Recent Lipid Panel No results found for: "CHOL", "TRIG", "HDL", "CHOLHDL", "LDLCALC", "LDLDIRECT"  Physical Exam:    VS:  BP 98/62 (BP Location: Left Arm, Patient Position: Sitting, Cuff Size: Normal)   Pulse 76   Ht '5\' 8"'$  (1.727 m)   Wt 85.5 kg   LMP 06/14/2022   SpO2 99%   BMI 28.65 kg/m     Wt Readings from Last 3 Encounters:  12/27/22 85.5 kg  09/29/22 82.2 kg  05/30/22 82.3 kg     GEN:  Well nourished, well developed in no acute distress HEENT: Normal NECK: No JVD; No carotid bruits LYMPHATICS: No lymphadenopathy CARDIAC: RRR, no murmurs, rubs, gallops RESPIRATORY:  Clear to auscultation without rales, wheezing or rhonchi  ABDOMEN: Soft, non-tender, non-distended MUSCULOSKELETAL:  No edema; No deformity  SKIN: Warm and dry NEUROLOGIC:  Alert and oriented x 3 PSYCHIATRIC:  Normal affect    Risk Assessment/Risk Calculators:  ASSESSMENT & PLAN:    Chronic hypertension pregnancy-her blood pressure is on the lower side.  She feels  lightheaded.  Will cut back on her carvedilol to 6.25 mg twice daily.  Her systolic blood pressure today is in the 90s. She is going to monitor her blood pressure closely and make sure that if it starts to go up we will adjust her Coreg as appropriate.  Continue aspirin for preeclampsia prophylaxis.  I am excited for the patient.  The patient is in agreement with the above plan. The patient left the office in stable condition.  The patient will follow up in 8 weeks or sooner if needed.  Patient Instructions  Medication Instructions:  Your physician has recommended you make the following change in your medication:  DECREASE: Coreg 6.25 mg twice daily *If you need a refill on your cardiac medications before your next appointment, please call your pharmacy*   Lab Work: None   Testing/Procedures: None   Follow-Up: At [facility], you and your health needs are our priority.  As part of our continuing mission to provide you with exceptional heart care, we have created designated Provider Care Teams.  These Care Teams include your primary Cardiologist (physician) and Advanced Practice Providers (APPs -  Physician Assistants and Nurse Practitioners) who all work together to provide you with the care you need, when you need it.   Your next appointment:   8 week(s) okay overbook  Provider:   Berniece Salines, DO    Dispo:  No follow-ups on file.   Medication Adjustments/Labs and Tests Ordered: Current medicines are reviewed at length with the patient today.  Concerns regarding medicines are outlined above.  Tests Ordered: Orders Placed This Encounter  Procedures   EKG 12-Lead   Medication Changes: Meds ordered this encounter  Medications   carvedilol (COREG) 6.25 MG tablet    Sig: Take 1 tablet (6.25 mg total) by mouth 2 (two) times daily.    Dispense:  180 tablet    Refill:  3

## 2022-12-29 DIAGNOSIS — G4486 Cervicogenic headache: Secondary | ICD-10-CM | POA: Diagnosis not present

## 2022-12-29 DIAGNOSIS — M9901 Segmental and somatic dysfunction of cervical region: Secondary | ICD-10-CM | POA: Diagnosis not present

## 2022-12-29 DIAGNOSIS — M9905 Segmental and somatic dysfunction of pelvic region: Secondary | ICD-10-CM | POA: Diagnosis not present

## 2022-12-29 DIAGNOSIS — M542 Cervicalgia: Secondary | ICD-10-CM | POA: Diagnosis not present

## 2023-01-02 DIAGNOSIS — Z23 Encounter for immunization: Secondary | ICD-10-CM | POA: Diagnosis not present

## 2023-01-02 DIAGNOSIS — Z3480 Encounter for supervision of other normal pregnancy, unspecified trimester: Secondary | ICD-10-CM | POA: Diagnosis not present

## 2023-01-03 DIAGNOSIS — M9901 Segmental and somatic dysfunction of cervical region: Secondary | ICD-10-CM | POA: Diagnosis not present

## 2023-01-03 DIAGNOSIS — G4486 Cervicogenic headache: Secondary | ICD-10-CM | POA: Diagnosis not present

## 2023-01-03 DIAGNOSIS — M542 Cervicalgia: Secondary | ICD-10-CM | POA: Diagnosis not present

## 2023-01-03 DIAGNOSIS — M9905 Segmental and somatic dysfunction of pelvic region: Secondary | ICD-10-CM | POA: Diagnosis not present

## 2023-01-04 ENCOUNTER — Ambulatory Visit: Payer: BC Managed Care – PPO | Attending: Obstetrics and Gynecology | Admitting: Physical Therapy

## 2023-01-04 ENCOUNTER — Encounter: Payer: Self-pay | Admitting: Physical Therapy

## 2023-01-04 ENCOUNTER — Other Ambulatory Visit: Payer: Self-pay

## 2023-01-04 DIAGNOSIS — R293 Abnormal posture: Secondary | ICD-10-CM | POA: Insufficient documentation

## 2023-01-04 DIAGNOSIS — M6281 Muscle weakness (generalized): Secondary | ICD-10-CM | POA: Diagnosis not present

## 2023-01-04 DIAGNOSIS — M62838 Other muscle spasm: Secondary | ICD-10-CM | POA: Insufficient documentation

## 2023-01-04 DIAGNOSIS — R102 Pelvic and perineal pain: Secondary | ICD-10-CM | POA: Insufficient documentation

## 2023-01-04 NOTE — Therapy (Signed)
OUTPATIENT PHYSICAL THERAPY FEMALE PELVIC EVALUATION   Patient Name: Alicia Nguyen MRN: [id number] DOB:Mar 21, 1986, 37 y.o., female Today's Date: 01/04/2023  END OF SESSION:  PT End of Session - 01/04/23 1403     Visit Number 1    Date for PT Re-Evaluation 03/29/23    Authorization Type BC[employer]    PT Start Time 1400    PT Stop Time 1440    PT Time Calculation (min) 40 min    Activity Tolerance Patient tolerated treatment well    Behavior During Therapy [facility] for tasks assessed/performed             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   Gestational hypertension, third trimester 06/04/2021   History of pre-eclampsia 03/15/2021   Hypertension    Medication management 10/15/2021   Multigravida of advanced maternal age in third trimester 06/04/2021   Preeclampsia    Pregnancy 01/06/2021   Pregnancy induced hypertension    Pregnant    Past Surgical History:  Procedure Laterality Date   BRAIN SURGERY  1989   Optic glioma removal   EYE SURGERY     Strabismus right eye   FOOT FRACTURE SURGERY     Optic gioma removal     REFRACTIVE SURGERY     Patient Active Problem List   Diagnosis Date Noted   Postpartum depression 05/30/2022   Encounter for cardiovascular postpartum visit 08/03/2021   Essential hypertension 01/06/2021    PCP: Jeanie Sewer, NP   REFERRING PROVIDER: Rowland Lathe, MD   REFERRING DIAG: R10.2 (ICD-10-CM) - Pelvic and perineal pain   THERAPY DIAG:  Muscle weakness (generalized)  Abnormal posture  Other muscle spasm  Rationale for Evaluation and Treatment: Rehabilitation  ONSET DATE: started about Christmas and getting worse  SUBJECTIVE:                                                                                                                                                                                            SUBJECTIVE STATEMENT: I have to lay down at 4pm everyday due to pain. I had pain for the last two weeks in the second pregnancy but not like this.   Fluid intake: Yes: mostly water    PAIN:  Are you having pain? Yes NPRS scale: 6/10 up to 9/10 Pain location: Bilateral, Vaginal, and hips and tailbone  Pain  type: aching and shooting Pain description: constant and just goes from 5-9/10 pain    Aggravating factors: standing up feels like everything locks up; standing Relieving factors: lying down and stretching, feels better in the morning  PRECAUTIONS: None and Other: pregnant  WEIGHT BEARING RESTRICTIONS: No  FALLS:  Has patient fallen in last 6 months? 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  PLOF: Independent  PATIENT GOALS: be able to do things and feel better  PERTINENT HISTORY:  2 previous pregnancy one with tearing, 3 pregnancies very close together Sexual abuse: No  BOWEL MOVEMENT: Pain with bowel movement: No Type of bowel movement:Type (Bristol Stool Scale) generally constipated, Frequency every other day, and Strain Yes sometimes Fully empty rectum: Yes:     URINATION: Pain with urination: No Fully empty bladder: No Stream: Strong Urgency: Yes: pregnancy related Frequency: pregnancy related Leakage: Coughing, Sneezing, and Laughing Pads: No  INTERCOURSE: Pain with intercourse:  No   PREGNANCY: Vaginal deliveries 2 Tearing Yes: first deg tear C-section deliveries 0 Currently pregnant Yes: 29 weeks  PROLAPSE:    OBJECTIVE:   DIAGNOSTIC FINDINGS:    PATIENT SURVEYS:    PFIQ-7   COGNITION: Overall cognitive status: Within functional limits for tasks assessed     SENSATION: Light  touch:  Proprioception: Appears intact  MUSCLE LENGTH: Hamstrings: WFL   LUMBAR SPECIAL TESTS:  Single leg stance test: Negative  FUNCTIONAL TESTS:    GAIT:  Comments: wider base of support  POSTURE: increased lumbar lordosis, anterior pelvic tilt, and pregnancy posture  PELVIC ALIGNMENT:  LUMBARAROM/PROM:  A/PROM A/PROM  eval  Flexion 50%  Extension   Right lateral flexion   Left lateral flexion   Right rotation   Left rotation    (Blank rows = not tested)  LOWER EXTREMITY ROM: WFL   Passive ROM Right eval Left eval  Hip flexion    Hip extension    Hip abduction    Hip adduction    Hip internal rotation    Hip external rotation    Knee flexion    Knee extension    Ankle dorsiflexion    Ankle plantarflexion    Ankle inversion    Ankle eversion     (Blank rows = not tested)  LOWER EXTREMITY MMT: grossly 5/5 but slightly more difficulty to hold the left  MMT Right eval Left eval  Hip flexion    Hip extension    Hip abduction    Hip adduction    Hip internal rotation    Hip external rotation    Knee flexion    Knee extension    Ankle dorsiflexion    Ankle plantarflexion    Ankle inversion    Ankle eversion     PALPATION:   General  very tender to palpation left more than Rt - piriformis, L2/3, SI joint, TFL, gluteals                External Perineal Exam tender to palpation throughout and tight adductors bil                             Internal Pelvic Floor high tone - relaxes with gentle fascial release  Patient confirms identification and approves PT to assess internal pelvic floor and treatment Yes  PELVIC MMT:   MMT eval  Vaginal  3/5  x10 reps then 1 x 8 sec hold  Internal Anal Sphincter   External Anal Sphincter   Puborectalis   Diastasis Recti   (Blank rows = not tested)        TONE: high  PROLAPSE: Mild Anterior wall with valsalva  TODAY'S TREATMENT:                                                                                                                               DATE: 01/04/23  EVAL and initial HEP   PATIENT EDUCATION:  Education details: Access Code: [email or web address] Person educated: Patient Education method: Explanation, Demonstration, Tactile cues, Verbal cues, and Handouts Education comprehension: verbalized understanding  HOME EXERCISE PROGRAM: Access Code: [email or web address] URL: [email or web address] Date: 01/04/2023 Prepared by: Jari Favre  Exercises - Standing 'L' Stretch at Counter  - 1 x daily - 7 x weekly - 3 sets - 10 reps - Supine Diaphragmatic Breathing  - 3 x daily - 7 x weekly - 1 sets - 10 reps  ASSESSMENT:  CLINICAL IMPRESSION: Patient is a 37 y.o. [redacted] week pregnant female who was seen today for physical therapy evaluation and treatment for pelvic pain. Pt has a lot of tight and tender muscles throughout core.  Pt has some relief with pelvic compression demonstrating core weakness being the driver of the pain.  Pt has lower endurance of the pelvic floor as well.  Pt will benefit from skilled PT to address muscles, posture and strength impairments to restore to maximum function and for healthy pregnancy.  OBJECTIVE IMPAIRMENTS: decreased coordination, decreased endurance, difficulty walking, decreased ROM, decreased strength, increased muscle spasms, impaired flexibility, impaired tone, postural dysfunction, and pain.   ACTIVITY LIMITATIONS: carrying, lifting, sitting, standing, transfers, continence, and toileting  PARTICIPATION LIMITATIONS: meal prep, cleaning, laundry, and community activity  PERSONAL FACTORS: 1-2 comorbidities: 2 previous pregnancy one with tearing, 3 pregnancies very close together, pregnant  are also affecting patient's functional outcome.   REHAB POTENTIAL: Excellent  CLINICAL DECISION MAKING: Evolving/moderate complexity  EVALUATION COMPLEXITY: Moderate   GOALS: Goals reviewed with patient? Yes  SHORT TERM GOALS: Target date:  01/31/23  Ind with initial HEP Baseline: Goal status: INITIAL   LONG TERM GOALS: Target date: 03/29/23  Pt will be independent with advanced HEP to maintain improvements made throughout therapy  Baseline:  Goal status: INITIAL  2.  Pt will report 75% reduction of pain due to improvements in posture, strength, and muscle length  Baseline:  Goal status: INITIAL  3.  Pt will be able to continue to daily activities until bedtime and not having to stop being active at 4pm due to pain management Baseline:  Goal status: INITIAL  4.  Pt will have knowledge of how to perform active labor positions for healthy labor and delivery techniques Baseline:  Goal status: INITIAL   PLAN:  PT FREQUENCY: 1x/week  PT DURATION: 12 weeks  PLANNED INTERVENTIONS: Therapeutic exercises, Therapeutic activity, Neuromuscular re-education, Balance  training, Gait training, Patient/Family education, Self Care, Joint mobilization, Aquatic Therapy, Electrical stimulation, Cryotherapy, Moist heat, Taping, Biofeedback, Manual therapy, and Re-evaluation  PLAN FOR NEXT SESSION: re-assess pelvic floor at 35-[redacted] weeks gestation; lumbar, gluteal, adductors massage and addaday, core strengthening program   [employer], PT 01/04/2023, 2:40 PM

## 2023-01-09 DIAGNOSIS — M9905 Segmental and somatic dysfunction of pelvic region: Secondary | ICD-10-CM | POA: Diagnosis not present

## 2023-01-09 DIAGNOSIS — M9901 Segmental and somatic dysfunction of cervical region: Secondary | ICD-10-CM | POA: Diagnosis not present

## 2023-01-09 DIAGNOSIS — M542 Cervicalgia: Secondary | ICD-10-CM | POA: Diagnosis not present

## 2023-01-09 DIAGNOSIS — G4486 Cervicogenic headache: Secondary | ICD-10-CM | POA: Diagnosis not present

## 2023-01-10 ENCOUNTER — Ambulatory Visit: Payer: BC Managed Care – PPO | Admitting: Physical Therapy

## 2023-01-10 DIAGNOSIS — R102 Pelvic and perineal pain: Secondary | ICD-10-CM | POA: Diagnosis not present

## 2023-01-10 DIAGNOSIS — R293 Abnormal posture: Secondary | ICD-10-CM

## 2023-01-10 DIAGNOSIS — M62838 Other muscle spasm: Secondary | ICD-10-CM

## 2023-01-10 DIAGNOSIS — M6281 Muscle weakness (generalized): Secondary | ICD-10-CM

## 2023-01-10 NOTE — Therapy (Signed)
OUTPATIENT PHYSICAL THERAPY FEMALE PELVIC TREATMENT   Patient Name: Alicia Nguyen MRN: [id number] DOB:04/25/1986, 37 y.o., female Today's Date: 01/10/2023  END OF SESSION:    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   Gestational hypertension, third trimester 06/04/2021   History of pre-eclampsia 03/15/2021   Hypertension    Medication management 10/15/2021   Multigravida of advanced maternal age in third trimester 06/04/2021   Preeclampsia    Pregnancy 01/06/2021   Pregnancy induced hypertension    Pregnant    Past Surgical History:  Procedure Laterality Date   BRAIN SURGERY  1989   Optic glioma removal   EYE SURGERY     Strabismus right eye   FOOT FRACTURE SURGERY     Optic gioma removal     REFRACTIVE SURGERY     Patient Active Problem List   Diagnosis Date Noted   Postpartum depression 05/30/2022   Encounter for cardiovascular postpartum visit 08/03/2021   Essential hypertension 01/06/2021    PCP: Jeanie Sewer, NP   REFERRING PROVIDER: Rowland Lathe, MD   REFERRING DIAG: R10.2 (ICD-10-CM) - Pelvic and perineal pain   THERAPY DIAG:  No diagnosis found.  Rationale for Evaluation and Treatment: Rehabilitation  ONSET DATE: started about Christmas and getting worse  SUBJECTIVE:                                                                                                                                                                                           SUBJECTIVE STATEMENT: I have soreness today in the same places.  I have been doing the breathing and the stretch on the table Fluid intake: Yes: mostly water    PAIN:  Are you having pain? Yes NPRS scale: 6/10 up to 9/10 Pain  location: Bilateral, Vaginal, and hips and tailbone  Pain type: aching and shooting Pain description: constant and just goes from 5-9/10 pain    Aggravating factors: standing up feels like everything locks up; standing Relieving factors: lying down and stretching, feels better in the morning  PRECAUTIONS: None and Other: pregnant  WEIGHT BEARING RESTRICTIONS: No  FALLS:  Has patient fallen in last 6 months? 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  PLOF: Independent  PATIENT GOALS: be able to do  things and feel better  PERTINENT HISTORY:  2 previous pregnancy one with tearing, 3 pregnancies very close together Sexual abuse: No  BOWEL MOVEMENT: Pain with bowel movement: No Type of bowel movement:Type (Bristol Stool Scale) generally constipated, Frequency every other day, and Strain Yes sometimes Fully empty rectum: Yes:     URINATION: Pain with urination: No Fully empty bladder: No Stream: Strong Urgency: Yes: pregnancy related Frequency: pregnancy related Leakage: Coughing, Sneezing, and Laughing Pads: No  INTERCOURSE: Pain with intercourse:  No   PREGNANCY: Vaginal deliveries 2 Tearing Yes: first deg tear C-section deliveries 0 Currently pregnant Yes: 29 weeks  PROLAPSE:    OBJECTIVE:   DIAGNOSTIC FINDINGS:    PATIENT SURVEYS:    PFIQ-7   COGNITION: Overall cognitive status: Within functional limits for tasks assessed     SENSATION: Light touch:  Proprioception: Appears intact  MUSCLE LENGTH: Hamstrings: WFL   LUMBAR SPECIAL TESTS:  Single leg stance test: Negative  FUNCTIONAL TESTS:    GAIT:  Comments: wider base of support  POSTURE: increased lumbar lordosis, anterior pelvic tilt, and pregnancy posture  PELVIC ALIGNMENT:  LUMBARAROM/PROM:  A/PROM A/PROM  eval  Flexion 50%  Extension   Right lateral flexion    Left lateral flexion   Right rotation   Left rotation    (Blank rows = not tested)  LOWER EXTREMITY ROM: WFL   Passive ROM Right eval Left eval  Hip flexion    Hip extension    Hip abduction    Hip adduction    Hip internal rotation    Hip external rotation    Knee flexion    Knee extension    Ankle dorsiflexion    Ankle plantarflexion    Ankle inversion    Ankle eversion     (Blank rows = not tested)  LOWER EXTREMITY MMT: grossly 5/5 but slightly more difficulty to hold the left  MMT Right eval Left eval  Hip flexion    Hip extension    Hip abduction    Hip adduction    Hip internal rotation    Hip external rotation    Knee flexion    Knee extension    Ankle dorsiflexion    Ankle plantarflexion    Ankle inversion    Ankle eversion     PALPATION:   General  very tender to palpation left more than Rt - piriformis, L2/3, SI joint, TFL, gluteals                External Perineal Exam tender to palpation throughout and tight adductors bil                             Internal Pelvic Floor high tone - relaxes with gentle fascial release  Patient confirms identification and approves PT to assess internal pelvic floor and treatment Yes  PELVIC MMT:   MMT eval  Vaginal 3/5  x10 reps then 1 x 8 sec hold  Internal Anal Sphincter   External Anal Sphincter   Puborectalis   Diastasis Recti   (Blank rows = not tested)        TONE: high  PROLAPSE: Mild Anterior wall with valsalva  TODAY'S TREATMENT:  DATE: 01/10/23  Manual: Bil gluteals, lumbar, obturator in sidelying Exercises: - Warm Up Cat  - 1 x daily - 7 x weekly - 3 sets - 10 reps - Quadruped Circle Weight Shifts  - 1 x daily - 7 x weekly - 3 sets - 10 reps - kegel with hip internal rotation  - 1 x daily - 7 x weekly - 1 sets - 3 reps - 30 sec hold - Supine Hip Internal and  External Rotation  - 1 x daily - 7 x weekly - 1 sets - 10 reps - 5 sec hold   PATIENT EDUCATION:  Education details: Access Code: [email or web address] Person educated: Patient Education method: Explanation, Demonstration, Tactile cues, Verbal cues, and Handouts Education comprehension: verbalized understanding  HOME EXERCISE PROGRAM: Access Code: [email or web address] Access Code: [email or web address] URL: [email or web address] Date: 01/10/2023 Prepared by: Jari Favre  Exercises - Standing 'L' Stretch at Counter  - 1 x daily - 7 x weekly - 3 sets - 10 reps - Supine Diaphragmatic Breathing  - 3 x daily - 7 x weekly - 1 sets - 10 reps - Warm Up Cat  - 1 x daily - 7 x weekly - 3 sets - 10 reps - Quadruped Circle Weight Shifts  - 1 x daily - 7 x weekly - 3 sets - 10 reps - kegel with hip internal rotation  - 1 x daily - 7 x weekly - 1 sets - 3 reps - 30 sec hold - Supine Hip Internal and External Rotation  - 1 x daily - 7 x weekly - 1 sets - 10 reps - 5 sec hold  ASSESSMENT:  CLINICAL IMPRESSION: Patient is doing well with initial HEP.  Today's session focused on release of muscle tension throughout low back, gluteals and obturator bil.  Pt felt less tension at the end of today's session and was shown stretches to continue to address shortened muscles. Pt will benefit from skilled PT to work on core strength   OBJECTIVE IMPAIRMENTS: decreased coordination, decreased endurance, difficulty walking, decreased ROM, decreased strength, increased muscle spasms, impaired flexibility, impaired tone, postural dysfunction, and pain.   ACTIVITY LIMITATIONS: carrying, lifting, sitting, standing, transfers, continence, and toileting  PARTICIPATION LIMITATIONS: meal prep, cleaning, laundry, and community activity  PERSONAL FACTORS: 1-2 comorbidities: 2 previous pregnancy one with tearing, 3 pregnancies very close together, pregnant  are also affecting patient's functional outcome.   REHAB POTENTIAL:  Excellent  CLINICAL DECISION MAKING: Evolving/moderate complexity  EVALUATION COMPLEXITY: Moderate   GOALS: Goals reviewed with patient? Yes  SHORT TERM GOALS: Target date: 01/31/23  Ind with initial HEP Baseline: Goal status: MET   LONG TERM GOALS: Target date: 03/29/23  Pt will be independent with advanced HEP to maintain improvements made throughout therapy  Baseline:  Goal status: INITIAL  2.  Pt will report 75% reduction of pain due to improvements in posture, strength, and muscle length  Baseline:  Goal status: INITIAL  3.  Pt will be able to continue to daily activities until bedtime and not having to stop being active at 4pm due to pain management Baseline:  Goal status: INITIAL  4.  Pt will have knowledge of how to perform active labor positions for healthy labor and delivery techniques Baseline:  Goal status: INITIAL   PLAN:  PT FREQUENCY: 1x/week  PT DURATION: 12 weeks  PLANNED INTERVENTIONS: Therapeutic exercises, Therapeutic activity, Neuromuscular re-education, Balance training, Gait training, Patient/Family education, Self Care, Joint mobilization, Aquatic Therapy, Electrical stimulation, Cryotherapy, Moist heat, Taping, Biofeedback,  Manual therapy, and Re-evaluation  PLAN FOR NEXT SESSION: re-assess pelvic floor at 35-[redacted] weeks gestation; lumbar, gluteal, massage and addaday if needed, initiate core strengthening program   Jule Ser, PT 01/10/2023, 11:18 AM

## 2023-01-11 DIAGNOSIS — M542 Cervicalgia: Secondary | ICD-10-CM | POA: Diagnosis not present

## 2023-01-11 DIAGNOSIS — G4486 Cervicogenic headache: Secondary | ICD-10-CM | POA: Diagnosis not present

## 2023-01-11 DIAGNOSIS — M9905 Segmental and somatic dysfunction of pelvic region: Secondary | ICD-10-CM | POA: Diagnosis not present

## 2023-01-11 DIAGNOSIS — M9901 Segmental and somatic dysfunction of cervical region: Secondary | ICD-10-CM | POA: Diagnosis not present

## 2023-01-12 ENCOUNTER — Ambulatory Visit: Payer: BC Managed Care – PPO | Admitting: Physical Therapy

## 2023-01-16 ENCOUNTER — Ambulatory Visit: Payer: BC Managed Care – PPO | Attending: Obstetrics and Gynecology

## 2023-01-16 ENCOUNTER — Other Ambulatory Visit: Payer: Self-pay | Admitting: *Deleted

## 2023-01-16 ENCOUNTER — Ambulatory Visit: Payer: BC Managed Care – PPO | Admitting: *Deleted

## 2023-01-16 VITALS — BP 116/62 | HR 80

## 2023-01-16 DIAGNOSIS — O09523 Supervision of elderly multigravida, third trimester: Secondary | ICD-10-CM

## 2023-01-16 DIAGNOSIS — O10912 Unspecified pre-existing hypertension complicating pregnancy, second trimester: Secondary | ICD-10-CM | POA: Diagnosis not present

## 2023-01-16 DIAGNOSIS — O09293 Supervision of pregnancy with other poor reproductive or obstetric history, third trimester: Secondary | ICD-10-CM | POA: Diagnosis not present

## 2023-01-16 DIAGNOSIS — O09299 Supervision of pregnancy with other poor reproductive or obstetric history, unspecified trimester: Secondary | ICD-10-CM | POA: Insufficient documentation

## 2023-01-16 DIAGNOSIS — O09522 Supervision of elderly multigravida, second trimester: Secondary | ICD-10-CM | POA: Insufficient documentation

## 2023-01-16 DIAGNOSIS — O10013 Pre-existing essential hypertension complicating pregnancy, third trimester: Secondary | ICD-10-CM

## 2023-01-16 DIAGNOSIS — O10913 Unspecified pre-existing hypertension complicating pregnancy, third trimester: Secondary | ICD-10-CM | POA: Insufficient documentation

## 2023-01-16 DIAGNOSIS — Z3689 Encounter for other specified antenatal screening: Secondary | ICD-10-CM | POA: Diagnosis not present

## 2023-01-16 DIAGNOSIS — Z3A30 30 weeks gestation of pregnancy: Secondary | ICD-10-CM

## 2023-01-16 DIAGNOSIS — O09899 Supervision of other high risk pregnancies, unspecified trimester: Secondary | ICD-10-CM | POA: Insufficient documentation

## 2023-01-16 DIAGNOSIS — Z148 Genetic carrier of other disease: Secondary | ICD-10-CM

## 2023-01-16 DIAGNOSIS — O10919 Unspecified pre-existing hypertension complicating pregnancy, unspecified trimester: Secondary | ICD-10-CM

## 2023-01-16 DIAGNOSIS — Z9889 Other specified postprocedural states: Secondary | ICD-10-CM

## 2023-01-17 ENCOUNTER — Encounter: Payer: Self-pay | Admitting: Physical Therapy

## 2023-01-17 ENCOUNTER — Ambulatory Visit: Payer: BC Managed Care – PPO | Admitting: Physical Therapy

## 2023-01-17 DIAGNOSIS — M542 Cervicalgia: Secondary | ICD-10-CM | POA: Diagnosis not present

## 2023-01-17 DIAGNOSIS — M6281 Muscle weakness (generalized): Secondary | ICD-10-CM | POA: Diagnosis not present

## 2023-01-17 DIAGNOSIS — M62838 Other muscle spasm: Secondary | ICD-10-CM | POA: Diagnosis not present

## 2023-01-17 DIAGNOSIS — R102 Pelvic and perineal pain: Secondary | ICD-10-CM | POA: Diagnosis not present

## 2023-01-17 DIAGNOSIS — M9905 Segmental and somatic dysfunction of pelvic region: Secondary | ICD-10-CM | POA: Diagnosis not present

## 2023-01-17 DIAGNOSIS — M9901 Segmental and somatic dysfunction of cervical region: Secondary | ICD-10-CM | POA: Diagnosis not present

## 2023-01-17 DIAGNOSIS — R293 Abnormal posture: Secondary | ICD-10-CM

## 2023-01-17 DIAGNOSIS — G4486 Cervicogenic headache: Secondary | ICD-10-CM | POA: Diagnosis not present

## 2023-01-17 NOTE — Therapy (Signed)
OUTPATIENT PHYSICAL THERAPY FEMALE PELVIC TREATMENT   Patient Name: Alicia Nguyen MRN: [id number] DOB:1986/09/26, 37 y.o., female Today's Date: 01/17/2023  END OF SESSION:  PT End of Session - 01/17/23 1122     Visit Number 3    Date for PT Re-Evaluation 03/29/23    Authorization Type [employer]    PT Start Time 1106    PT Stop Time 1145    PT Time Calculation (min) 39 min    Activity Tolerance Patient tolerated treatment well    Behavior During Therapy [facility] for tasks assessed/performed              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   Gestational hypertension, third trimester 06/04/2021   History of pre-eclampsia 03/15/2021   Hypertension    Medication management 10/15/2021   Multigravida of advanced maternal age in third trimester 06/04/2021   Preeclampsia    Pregnancy 01/06/2021   Pregnancy induced hypertension    Pregnant    Past Surgical History:  Procedure Laterality Date   BRAIN SURGERY  1989   Optic glioma removal   EYE SURGERY     Strabismus right eye   FOOT FRACTURE SURGERY     Optic gioma removal     REFRACTIVE SURGERY     Patient Active Problem List   Diagnosis Date Noted   Postpartum depression 05/30/2022   Encounter for cardiovascular postpartum visit 08/03/2021   Essential hypertension 01/06/2021    PCP: Jeanie Sewer, NP   REFERRING PROVIDER: Rowland Lathe, MD   REFERRING DIAG: R10.2 (ICD-10-CM) - Pelvic and perineal pain   THERAPY DIAG:  Muscle weakness (generalized)  Abnormal posture  Other muscle spasm  Rationale for Evaluation and Treatment: Rehabilitation  ONSET DATE: started about Christmas and getting worse  SUBJECTIVE:                                                                                                                                                                                            SUBJECTIVE STATEMENT: I have soreness today in the same places. Stretches are helping Fluid intake: Yes: mostly water  pt is currently 30-31 weeks now  PAIN:  Are you having pain? Yes NPRS scale: 4/10 currently Pain location: Bilateral, Vaginal, and hips and tailbone, Lt hip and back of the leg  Pain type: soreness Pain description: constant and just  goes from 5-9/10 pain    Aggravating factors: standing up feels like everything locks up; standing Relieving factors: lying down and stretching, feels better in the morning  PRECAUTIONS: None and Other: pregnant  WEIGHT BEARING RESTRICTIONS: No  FALLS:  Has patient fallen in last 6 months? 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  PLOF: Independent  PATIENT GOALS: be able to do things and feel better  PERTINENT HISTORY:  2 previous pregnancy one with tearing, 3 pregnancies very close together Sexual abuse: No  BOWEL MOVEMENT: Pain with bowel movement: No Type of bowel movement:Type (Bristol Stool Scale) generally constipated, Frequency every other day, and Strain Yes sometimes Fully empty rectum: Yes:     URINATION: Pain with urination: No Fully empty bladder: No Stream: Strong Urgency: Yes: pregnancy related Frequency: pregnancy related Leakage: Coughing, Sneezing, and Laughing Pads: No  INTERCOURSE: Pain with intercourse:  No   PREGNANCY: Vaginal deliveries 2 Tearing Yes: first deg tear C-section deliveries 0 Currently pregnant Yes: 29 weeks  PROLAPSE:    OBJECTIVE:   DIAGNOSTIC FINDINGS:    PATIENT SURVEYS:    PFIQ-7   COGNITION: Overall cognitive status: Within functional limits for tasks assessed     SENSATION: Light touch:   Proprioception: Appears intact  MUSCLE LENGTH: Hamstrings: WFL   LUMBAR SPECIAL TESTS:  Single leg stance test: Negative  FUNCTIONAL TESTS:    GAIT:  Comments: wider base of support  POSTURE: increased lumbar lordosis, anterior pelvic tilt, and pregnancy posture  PELVIC ALIGNMENT:  LUMBARAROM/PROM:  A/PROM A/PROM  eval  Flexion 50%  Extension   Right lateral flexion   Left lateral flexion   Right rotation   Left rotation    (Blank rows = not tested)  LOWER EXTREMITY ROM: WFL   Passive ROM Right eval Left eval  Hip flexion    Hip extension    Hip abduction    Hip adduction    Hip internal rotation    Hip external rotation    Knee flexion    Knee extension    Ankle dorsiflexion    Ankle plantarflexion    Ankle inversion    Ankle eversion     (Blank rows = not tested)  LOWER EXTREMITY MMT: grossly 5/5 but slightly more difficulty to hold the left  MMT Right eval Left eval  Hip flexion    Hip extension    Hip abduction    Hip adduction    Hip internal rotation    Hip external rotation    Knee flexion    Knee extension    Ankle dorsiflexion    Ankle plantarflexion    Ankle inversion    Ankle eversion     PALPATION:   General  very tender to palpation left more than Rt - piriformis, L2/3, SI joint, TFL, gluteals                External Perineal Exam tender to palpation throughout and tight adductors bil                             Internal Pelvic Floor high tone - relaxes with gentle fascial release  Patient confirms identification and approves PT to assess internal pelvic floor and treatment Yes  PELVIC MMT:   MMT eval  Vaginal 3/5  x10 reps then 1 x 8 sec  hold  Internal Anal Sphincter   External Anal Sphincter   Puborectalis   Diastasis Recti   (Blank rows = not tested)        TONE: high  PROLAPSE: Mild Anterior wall with valsalva  TODAY'S TREATMENT:                                                                                                                               DATE: 01/17/23  Manual: Bil gluteals, lumbar Lt>Rt Exercises: Semi-reclined ball with core engaged Semi-reclined ball with overheard reach Semi-reclined ball to opposite LE Semi-reclined posterior pelvic tilt 3 reps and increased pain Semi-reclined green loop hip abduction Green loop marching in supine Semi-reclined - 1 min each side Standing exhale and reach Exhale with turning in bed    PATIENT EDUCATION:  Education details: Access Code: [email or web address] Person educated: Patient Education method: Explanation, Demonstration, Tactile cues, Verbal cues, and Handouts Education comprehension: verbalized understanding  HOME EXERCISE PROGRAM: Access Code: [email or web address] URL: [email or web address] Date: 01/10/2023 Prepared by: Jari Favre  Exercises - Standing 'L' Stretch at Counter  - 1 x daily - 7 x weekly - 3 sets - 10 reps - Supine Diaphragmatic Breathing  - 3 x daily - 7 x weekly - 1 sets - 10 reps - Warm Up Cat  - 1 x daily - 7 x weekly - 3 sets - 10 reps - Quadruped Circle Weight Shifts  - 1 x daily - 7 x weekly - 3 sets - 10 reps - kegel with hip internal rotation  - 1 x daily - 7 x weekly - 1 sets - 3 reps - 30 sec hold - Supine Hip Internal and External Rotation  - 1 x daily - 7 x weekly - 1 sets - 10 reps - 5 sec hold  ASSESSMENT:  CLINICAL IMPRESSION: Patient is doing well with initial HEP.  Today's session focused on release left gluteals more than any other region. Able to progress core strength and added exercises to HEP. Pt will benefit from skilled PT to work on core strength   OBJECTIVE IMPAIRMENTS: decreased coordination, decreased endurance, difficulty walking, decreased ROM, decreased strength, increased muscle spasms, impaired flexibility, impaired tone, postural dysfunction, and pain.   ACTIVITY LIMITATIONS: carrying, lifting, sitting, standing, transfers, continence, and toileting  PARTICIPATION  LIMITATIONS: meal prep, cleaning, laundry, and community activity  PERSONAL FACTORS: 1-2 comorbidities: 2 previous pregnancy one with tearing, 3 pregnancies very close together, pregnant  are also affecting patient's functional outcome.   REHAB POTENTIAL: Excellent  CLINICAL DECISION MAKING: Evolving/moderate complexity  EVALUATION COMPLEXITY: Moderate   GOALS: Goals reviewed with patient? Yes  SHORT TERM GOALS: Target date: 01/31/23  Ind with initial HEP Baseline: Goal status: MET   LONG TERM GOALS: Target date: 03/29/23  Pt will be independent with advanced HEP to maintain improvements made throughout therapy  Baseline:  Goal status: IN PROGRESS  2.  Pt will report 75% reduction of pain due to  improvements in posture, strength, and muscle length  Baseline:  Goal status: INITIAL  3.  Pt will be able to continue to daily activities until bedtime and not having to stop being active at 4pm due to pain management Baseline:  Goal status: INITIAL  4.  Pt will have knowledge of how to perform active labor positions for healthy labor and delivery techniques Baseline:  Goal status: INITIAL   PLAN:  PT FREQUENCY: 1x/week  PT DURATION: 12 weeks  PLANNED INTERVENTIONS: Therapeutic exercises, Therapeutic activity, Neuromuscular re-education, Balance training, Gait training, Patient/Family education, Self Care, Joint mobilization, Aquatic Therapy, Electrical stimulation, Cryotherapy, Moist heat, Taping, Biofeedback, Manual therapy, and Re-evaluation  PLAN FOR NEXT SESSION: re-assess pelvic floor at 35-[redacted] weeks gestation; lumbar, gluteal, massage and addaday if needed, progress core strengthening program   [employer], PT 01/17/2023, 11:50 AM

## 2023-01-18 DIAGNOSIS — Z369 Encounter for antenatal screening, unspecified: Secondary | ICD-10-CM | POA: Diagnosis not present

## 2023-01-19 ENCOUNTER — Encounter: Payer: BC Managed Care – PPO | Admitting: Physical Therapy

## 2023-02-01 ENCOUNTER — Ambulatory Visit: Payer: BC Managed Care – PPO | Attending: Obstetrics | Admitting: *Deleted

## 2023-02-01 ENCOUNTER — Ambulatory Visit: Payer: BC Managed Care – PPO | Admitting: Physical Therapy

## 2023-02-01 ENCOUNTER — Ambulatory Visit: Payer: BC Managed Care – PPO | Admitting: *Deleted

## 2023-02-01 ENCOUNTER — Ambulatory Visit: Payer: BC Managed Care – PPO | Attending: Obstetrics and Gynecology | Admitting: Physical Therapy

## 2023-02-01 VITALS — BP 132/76 | HR 82

## 2023-02-01 DIAGNOSIS — M6281 Muscle weakness (generalized): Secondary | ICD-10-CM | POA: Insufficient documentation

## 2023-02-01 DIAGNOSIS — R293 Abnormal posture: Secondary | ICD-10-CM

## 2023-02-01 DIAGNOSIS — O26893 Other specified pregnancy related conditions, third trimester: Secondary | ICD-10-CM | POA: Insufficient documentation

## 2023-02-01 DIAGNOSIS — O163 Unspecified maternal hypertension, third trimester: Secondary | ICD-10-CM | POA: Diagnosis not present

## 2023-02-01 DIAGNOSIS — O09523 Supervision of elderly multigravida, third trimester: Secondary | ICD-10-CM

## 2023-02-01 DIAGNOSIS — O09299 Supervision of pregnancy with other poor reproductive or obstetric history, unspecified trimester: Secondary | ICD-10-CM

## 2023-02-01 DIAGNOSIS — O10913 Unspecified pre-existing hypertension complicating pregnancy, third trimester: Secondary | ICD-10-CM

## 2023-02-01 DIAGNOSIS — Z3A32 32 weeks gestation of pregnancy: Secondary | ICD-10-CM | POA: Insufficient documentation

## 2023-02-01 DIAGNOSIS — O09893 Supervision of other high risk pregnancies, third trimester: Secondary | ICD-10-CM

## 2023-02-01 DIAGNOSIS — M62838 Other muscle spasm: Secondary | ICD-10-CM | POA: Diagnosis not present

## 2023-02-01 NOTE — Therapy (Signed)
OUTPATIENT PHYSICAL THERAPY FEMALE PELVIC TREATMENT   Patient Name: Alicia Nguyen MRN: [id number] DOB:1986-07-07, 37 y.o., female Today's Date: 02/01/2023  END OF SESSION:  PT End of Session - 02/01/23 1057     Visit Number 4    Date for PT Re-Evaluation 03/29/23    Authorization Type BCBS    PT Start Time 1057    PT Stop Time 1140    PT Time Calculation (min) 43 min    Activity Tolerance Patient tolerated treatment well    Behavior During Therapy [facility] for tasks assessed/performed               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   Gestational hypertension, third trimester 06/04/2021   History of pre-eclampsia 03/15/2021   Hypertension    Medication management 10/15/2021   Multigravida of advanced maternal age in third trimester 06/04/2021   Preeclampsia    Pregnancy 01/06/2021   Pregnancy induced hypertension    Pregnant    Past Surgical History:  Procedure Laterality Date   BRAIN SURGERY  1989   Optic glioma removal   EYE SURGERY     Strabismus right eye   FOOT FRACTURE SURGERY     Optic gioma removal     REFRACTIVE SURGERY     Patient Active Problem List   Diagnosis Date Noted   Postpartum depression 05/30/2022   Encounter for cardiovascular postpartum visit 08/03/2021   Essential hypertension 01/06/2021    PCP: Jeanie Sewer, NP   REFERRING PROVIDER: Rowland Lathe, MD   REFERRING DIAG: R10.2 (ICD-10-CM) - Pelvic and perineal pain   THERAPY DIAG:  Muscle weakness (generalized)  Abnormal posture  Other muscle spasm  Rationale for Evaluation and Treatment: Rehabilitation  ONSET DATE: started about Christmas and getting worse  SUBJECTIVE:                                                                                                                                                                                            SUBJECTIVE STATEMENT: I have soreness today in the same places. Stretches are helping and doing the exercises.  It is worse at night Fluid intake: Yes: mostly water    02/01/23 pt is currently 32.5 weeks now  PAIN:  Are you having pain? Yes NPRS scale: 4/10 currently Pain location: Bilateral, Vaginal, and hips and tailbone, Lt hip  and back of the leg  Pain type: soreness Pain description: constant and just goes from 5-9/10 pain    Aggravating factors: standing up feels like everything locks up; standing Relieving factors: lying down and stretching, feels better in the morning  PRECAUTIONS: None and Other: pregnant  WEIGHT BEARING RESTRICTIONS: No  FALLS:  Has patient fallen in last 6 months? 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  PLOF: Independent  PATIENT GOALS: be able to do things and feel better  PERTINENT HISTORY:  2 previous pregnancy one with tearing, 3 pregnancies very close together Sexual abuse: No  BOWEL MOVEMENT: Pain with bowel movement: No Type of bowel movement:Type (Bristol Stool Scale) generally constipated, Frequency every other day, and Strain Yes sometimes Fully empty rectum: Yes:     URINATION: Pain with urination: No Fully empty bladder: No Stream: Strong Urgency: Yes: pregnancy related Frequency: pregnancy related Leakage: Coughing, Sneezing, and Laughing Pads: No  INTERCOURSE: Pain with intercourse:  No   PREGNANCY: Vaginal deliveries 2 Tearing Yes: first deg tear C-section deliveries 0 Currently pregnant Yes: 29 weeks  PROLAPSE:    OBJECTIVE:   DIAGNOSTIC FINDINGS:    PATIENT SURVEYS:    PFIQ-7   COGNITION: Overall cognitive status: Within functional  limits for tasks assessed     SENSATION: Light touch:  Proprioception: Appears intact  MUSCLE LENGTH: Hamstrings: WFL   LUMBAR SPECIAL TESTS:  Single leg stance test: Negative  FUNCTIONAL TESTS:    GAIT:  Comments: wider base of support  POSTURE: increased lumbar lordosis, anterior pelvic tilt, and pregnancy posture  PELVIC ALIGNMENT:  LUMBARAROM/PROM:  A/PROM A/PROM  eval  Flexion 50%  Extension   Right lateral flexion   Left lateral flexion   Right rotation   Left rotation    (Blank rows = not tested)  LOWER EXTREMITY ROM: WFL   Passive ROM Right eval Left eval  Hip flexion    Hip extension    Hip abduction    Hip adduction    Hip internal rotation    Hip external rotation    Knee flexion    Knee extension    Ankle dorsiflexion    Ankle plantarflexion    Ankle inversion    Ankle eversion     (Blank rows = not tested)  LOWER EXTREMITY MMT: grossly 5/5 but slightly more difficulty to hold the left  MMT Right eval Left eval  Hip flexion    Hip extension    Hip abduction    Hip adduction    Hip internal rotation    Hip external rotation    Knee flexion    Knee extension    Ankle dorsiflexion    Ankle plantarflexion    Ankle inversion    Ankle eversion     PALPATION:   General  very tender to palpation left more than Rt - piriformis, L2/3, SI joint, TFL, gluteals                External Perineal Exam tender to palpation throughout and tight adductors bil                             Internal Pelvic Floor high tone - relaxes with gentle fascial release  Patient confirms identification and approves PT to assess internal pelvic floor and treatment Yes  PELVIC MMT:  MMT eval  Vaginal 3/5  x10 reps then 1 x 8 sec hold  Internal Anal Sphincter   External Anal Sphincter   Puborectalis   Diastasis Recti   (Blank rows = not tested)        TONE: high  PROLAPSE: Mild Anterior wall with valsalva  TODAY'S TREATMENT:                                                                                                                               DATE: 02/01/23  Manual: Bil gluteals, lumbar Lt>Rt, sacral stretching  Exercises: Hip flexion on the step 10x bil Hip shifter - 4 inch step Green loop standing hip abduction extension, flexion- 10x each side Half kneel 5 lb cables diagonal - 15lb Punch 5 lb - staggered stance - 10x Standing hip flexion - at step - 10x each side Standing at wall - 2lb - lifts - 2x10 Standing exhale and reach and push up at the bar - 20x     PATIENT EDUCATION:  Education details: Access Code: [email or web address] Person educated: Patient Education method: Explanation, Demonstration, Tactile cues, Verbal cues, and Handouts Education comprehension: verbalized understanding  HOME EXERCISE PROGRAM: Access Code: [email or web address] URL: [email or web address] Date: 01/10/2023 Prepared by: Jari Favre  Exercises - Standing 'L' Stretch at Counter  - 1 x daily - 7 x weekly - 3 sets - 10 reps - Supine Diaphragmatic Breathing  - 3 x daily - 7 x weekly - 1 sets - 10 reps - Warm Up Cat  - 1 x daily - 7 x weekly - 3 sets - 10 reps - Quadruped Circle Weight Shifts  - 1 x daily - 7 x weekly - 3 sets - 10 reps - kegel with hip internal rotation  - 1 x daily - 7 x weekly - 1 sets - 3 reps - 30 sec hold - Supine Hip Internal and External Rotation  - 1 x daily - 7 x weekly - 1 sets - 10 reps - 5 sec hold  ASSESSMENT:  CLINICAL IMPRESSION: Patient  able to progress core and hip strength today.  Pt did well with hip shift and hip flexion for ROM.  Still benefitting from [facility] to lumbar and gluteals - Lt more tight than Rt side today.  Pt will benefit from skilled PT to work on core strength and soft tissue and pelvic mobility  OBJECTIVE IMPAIRMENTS: decreased coordination, decreased endurance, difficulty walking, decreased ROM, decreased strength, increased muscle spasms, impaired flexibility, impaired tone,  postural dysfunction, and pain.   ACTIVITY LIMITATIONS: carrying, lifting, sitting, standing, transfers, continence, and toileting  PARTICIPATION LIMITATIONS: meal prep, cleaning, laundry, and community activity  PERSONAL FACTORS: 1-2 comorbidities: 2 previous pregnancy one with tearing, 3 pregnancies very close together, pregnant  are also affecting patient's functional outcome.   REHAB POTENTIAL: Excellent  CLINICAL DECISION MAKING: Evolving/moderate complexity  EVALUATION COMPLEXITY: Moderate   GOALS: Goals reviewed with patient? Yes  SHORT TERM GOALS:  Target date: 01/31/23  Ind with initial HEP Baseline: Goal status: MET   LONG TERM GOALS: Target date: 03/29/23  Pt will be independent with advanced HEP to maintain improvements made throughout therapy  Baseline:  Goal status: IN PROGRESS  2.  Pt will report 75% reduction of pain due to improvements in posture, strength, and muscle length  Baseline:  Goal status: IN PROGRESS  3.  Pt will be able to continue to daily activities until bedtime and not having to stop being active at 4pm due to pain management Baseline:  Goal status: IN PROGRESS  4.  Pt will have knowledge of how to perform active labor positions for healthy labor and delivery techniques Baseline:  Goal status: IN PROGRESS   PLAN:  PT FREQUENCY: 1x/week  PT DURATION: 12 weeks  PLANNED INTERVENTIONS: Therapeutic exercises, Therapeutic activity, Neuromuscular re-education, Balance training, Gait training, Patient/Family education, Self Care, Joint mobilization, Aquatic Therapy, Electrical stimulation, Cryotherapy, Moist heat, Taping, Biofeedback, Manual therapy, and Re-evaluation  PLAN FOR NEXT SESSION: re-assess pelvic in 2 more visits lumbar, gluteal, massage and addaday if needed, progress core strengthening program   [employer], PT 02/01/2023, 10:58 AM

## 2023-02-01 NOTE — Procedures (Signed)
Ceona Contorno January 16, 1986 [redacted]w[redacted]d  Fetus A Non-Stress Test Interpretation for 02/01/23  Indication: Chronic Hypertenstion and Advanced Maternal Age >40 years  Fetal Heart Rate A Mode: External Baseline Rate (A): 135 bpm Variability: Moderate Accelerations: 15 x 15 Decelerations: None Multiple birth?: No  Uterine Activity Mode: Toco Contraction Frequency (min): none Resting Tone Palpated: Relaxed  Interpretation (Fetal Testing) Nonstress Test Interpretation: Reactive Overall Impression: Reassuring for gestational age Comments: tracing reviewed by Dr. Donalee Citrin

## 2023-02-02 DIAGNOSIS — Z369 Encounter for antenatal screening, unspecified: Secondary | ICD-10-CM | POA: Diagnosis not present

## 2023-02-08 ENCOUNTER — Ambulatory Visit: Payer: BC Managed Care – PPO | Admitting: *Deleted

## 2023-02-08 ENCOUNTER — Ambulatory Visit: Payer: BC Managed Care – PPO | Attending: Obstetrics | Admitting: *Deleted

## 2023-02-08 VITALS — BP 122/65 | HR 92

## 2023-02-08 DIAGNOSIS — O09523 Supervision of elderly multigravida, third trimester: Secondary | ICD-10-CM | POA: Insufficient documentation

## 2023-02-08 DIAGNOSIS — O09893 Supervision of other high risk pregnancies, third trimester: Secondary | ICD-10-CM

## 2023-02-08 DIAGNOSIS — O10913 Unspecified pre-existing hypertension complicating pregnancy, third trimester: Secondary | ICD-10-CM | POA: Diagnosis not present

## 2023-02-08 DIAGNOSIS — Z3A33 33 weeks gestation of pregnancy: Secondary | ICD-10-CM | POA: Insufficient documentation

## 2023-02-08 NOTE — Procedures (Signed)
Saleemah Devenney Jan 13, 1986 [redacted]w[redacted]d  Fetus A Non-Stress Test Interpretation for 02/08/23  Indication: Chronic Hypertenstion and Advanced Maternal Age >40 years  Fetal Heart Rate A Mode: External Baseline Rate (A): 130 bpm Variability: Moderate Accelerations: 15 x 15 Decelerations: None Multiple birth?: No  Uterine Activity Mode: Toco Contraction Frequency (min): none Resting Tone Palpated: Relaxed  Interpretation (Fetal Testing) Nonstress Test Interpretation: Reactive Overall Impression: Reassuring for gestational age Comments: tracing reviewed byDr. Judeth Cornfield

## 2023-02-09 ENCOUNTER — Ambulatory Visit: Payer: BC Managed Care – PPO | Admitting: Physical Therapy

## 2023-02-09 ENCOUNTER — Other Ambulatory Visit: Payer: Self-pay | Admitting: Obstetrics and Gynecology

## 2023-02-09 DIAGNOSIS — O10919 Unspecified pre-existing hypertension complicating pregnancy, unspecified trimester: Secondary | ICD-10-CM

## 2023-02-13 ENCOUNTER — Encounter: Payer: BC Managed Care – PPO | Admitting: Physical Therapy

## 2023-02-13 DIAGNOSIS — Z369 Encounter for antenatal screening, unspecified: Secondary | ICD-10-CM | POA: Diagnosis not present

## 2023-02-15 ENCOUNTER — Ambulatory Visit: Payer: BC Managed Care – PPO | Attending: Obstetrics

## 2023-02-15 ENCOUNTER — Ambulatory Visit: Payer: BC Managed Care – PPO | Admitting: *Deleted

## 2023-02-15 ENCOUNTER — Encounter: Payer: Self-pay | Admitting: Cardiology

## 2023-02-15 ENCOUNTER — Ambulatory Visit: Payer: BC Managed Care – PPO | Attending: Cardiology | Admitting: Cardiology

## 2023-02-15 VITALS — BP 116/82 | HR 89

## 2023-02-15 VITALS — BP 118/88 | Ht 68.0 in | Wt 190.4 lb

## 2023-02-15 DIAGNOSIS — O09523 Supervision of elderly multigravida, third trimester: Secondary | ICD-10-CM

## 2023-02-15 DIAGNOSIS — O09293 Supervision of pregnancy with other poor reproductive or obstetric history, third trimester: Secondary | ICD-10-CM | POA: Diagnosis not present

## 2023-02-15 DIAGNOSIS — O10919 Unspecified pre-existing hypertension complicating pregnancy, unspecified trimester: Secondary | ICD-10-CM | POA: Insufficient documentation

## 2023-02-15 DIAGNOSIS — Z3A34 34 weeks gestation of pregnancy: Secondary | ICD-10-CM

## 2023-02-15 DIAGNOSIS — Z3A16 16 weeks gestation of pregnancy: Secondary | ICD-10-CM | POA: Diagnosis not present

## 2023-02-15 DIAGNOSIS — Z148 Genetic carrier of other disease: Secondary | ICD-10-CM

## 2023-02-15 DIAGNOSIS — Z9889 Other specified postprocedural states: Secondary | ICD-10-CM

## 2023-02-15 DIAGNOSIS — O10013 Pre-existing essential hypertension complicating pregnancy, third trimester: Secondary | ICD-10-CM | POA: Diagnosis not present

## 2023-02-15 NOTE — Progress Notes (Signed)
Cardio-Obstetrics Clinic  Follow Up Note   Date:  02/18/2023   ID:  Alicia Nguyen, DOB 08/06/1986, MRN [id number]  PCP:  Dulce Sellar, NP   [facility] Providers Cardiologist:  Thomasene Ripple, DO  Electrophysiologist:  None        Referring MD: Dulce Sellar, NP   Chief Complaint: "I am ok"  History of Present Illness:    Alicia Nguyen is a 37 y.o. female [G3P2002] who returns for follow up for chronic hypertension in pregnancy.  She has a history of pre-eclampsia during her last pregnancy (delivered in August 2021). Also with a history of HTN, for which she took antihypertensives from 2014-2018, but this reportedly resolved after she lost a significant amount of weight.    At her initial cardio-ob visit on 03/15/21 her BP was 110/72.  Therefore no medication changes were made.   At her visit on June 05, 2019 she told me that she had been experiencing elevated blood pressure.  And this was concerning.  During that visit her blood pressure was elevated I therefore started the patient on nifedipine 30 mg daily.  In the interim her blood pressure still remain elevated so her OB/GYN increase her nifedipine to 60 mg daily.  She had lightheadedness with the full dose.   I saw the patient on August 03, 2021 at that time she had delivered and was postpartum.  During that visit her blood pressure was averaging in the 150s to 120.  She was on nifedipine 30 mg daily.  I did not change her medication.  She was doing well on the current dose of antihypertensive medication.   At her visit on October 15, 2021 she still was experiencing gestational hypertension.  We stopped the nifedipine and started valsartan.  Transvaginally the patient did not tolerate this medication she will therefore valsartan was stopped.  She has been on beta-blocker recommendation for making was to start carvedilol however the patient preferred Lopressor since she has been in the past.  In the interim she  called because her blood pressure was not well controlled.  I added hydrochlorothiazide.  At her visit in May 2023 she had been switched from Lopressor to Coreg.  She is doing well.  She is now pregnant [redacted] weeks.  She is having significant nausea and vomiting but no other complaints.  At her last visit September 29, 2022, at that time no medication changes were made.    She is noticed lower blood pressure recently.  She is experiencing some dizziness from there.  Prior CV Studies Reviewed: The following studies were reviewed today: TTE IMPRESSIONS 02/03/2021  1. Left ventricular ejection fraction, by estimation, is 60 to 65%. The left ventricle has normal function. The left ventricle has no regional wall motion abnormalities. Left ventricular diastolic parameters were normal.   2. Right ventricular systolic function is normal. The right ventricular size is normal.   3. The mitral valve is normal in structure. No evidence of mitral valve regurgitation. No evidence of mitral stenosis.   4. The aortic valve is normal in structure. Aortic valve regurgitation is not visualized. No aortic stenosis is present.   5. The inferior vena cava is normal in size with greater than 50% respiratory variability, suggesting right atrial pressure of 3 mmHg.   FINDINGS   Left Ventricle: Left ventricular ejection fraction, by estimation, is 60 to 65%. The left ventricle has normal function. The left ventricle has no regional wall motion abnormalities. The left ventricular internal cavity  size was normal in size. There is   no left ventricular hypertrophy. Left ventricular diastolic parameters  were normal.   Right Ventricle: The right ventricular size is normal. No increase in right ventricular wall thickness. Right ventricular systolic function is normal.   Left Atrium: Left atrial size was normal in size.   Right Atrium: Right atrial size was normal in size.   Pericardium: There is no evidence of pericardial  effusion.   Mitral Valve: The mitral valve is normal in structure. No evidence of mitral valve regurgitation. No evidence of mitral valve stenosis.   Tricuspid Valve: The tricuspid valve is normal in structure. Tricuspid valve regurgitation is not demonstrated. No evidence of tricuspid stenosis.   Aortic Valve: The aortic valve is normal in structure. Aortic valve regurgitation is not visualized. No aortic stenosis is present.   Pulmonic Valve: The pulmonic valve was normal in structure. Pulmonic valve regurgitation is not visualized. No evidence of pulmonic stenosis.   Aorta: The aortic root is normal in size and structure.   Venous: The inferior vena cava is normal in size with greater than 50% respiratory variability, suggesting right atrial pressure of 3 mmHg.   IAS/Shunts: No atrial level shunt detected by color flow Doppler.      [place] of lower extremities 02/15/2021 Examination Guidelines:  A complete evaluation includes B-mode imaging, spectral Doppler, color  Doppler,  and power Doppler as needed of all accessible portions of each vessel.  Bilateral  testing is considered an integral part of a complete examination. Limited examinations for reoccurring indications may be performed as noted. The  reflux  portion of the exam is performed with the patient in reverse  Trendelenburg.    Past Medical History:  Diagnosis Date   Acute bronchitis 11/06/2009   Qualifier: Diagnosis of  By: Gabriel Rung LPN, Harriett Sine     Advanced maternal age in multigravida, second trimester 03/15/2021   Anxiety 2010   Chronic hypertension affecting pregnancy 07/24/2021   Depression 11/14/2021   Post partum depression   Gestational hypertension, third trimester 06/04/2021   History of pre-eclampsia 03/15/2021   Hypertension    Medication management 10/15/2021   Multigravida of advanced maternal age in third trimester 06/04/2021   Preeclampsia    Pregnancy 01/06/2021   Pregnancy induced hypertension    Pregnant      Past Surgical History:  Procedure Laterality Date   BRAIN SURGERY  1989   Optic glioma removal   EYE SURGERY     Strabismus right eye   FOOT FRACTURE SURGERY     Optic gioma removal     REFRACTIVE SURGERY        OB History     Gravida  3   Para  2   Term  2   Preterm  0   AB  0   Living  2      SAB  0   IAB  0   Ectopic  0   Multiple  0   Live Births  2               Current Medications: Current Meds  Medication Sig   aspirin EC 81 MG tablet Take 81 mg by mouth daily. Swallow whole.   carvedilol (COREG) 6.25 MG tablet Take 1 tablet (6.25 mg total) by mouth 2 (two) times daily.   omeprazole (PRILOSEC) 20 MG capsule Take 20 mg by mouth daily.   ondansetron (ZOFRAN-ODT) 8 MG disintegrating tablet Take 8 mg by mouth 3 (  three) times daily.   Prenatal Multivit-Min-Fe-FA (PRE-NATAL PO) Take by mouth.   promethazine (PHENERGAN) 25 MG tablet Take 25 mg by mouth every 6 (six) hours as needed.   sertraline (ZOLOFT) 50 MG tablet Take 50 mg by mouth daily.     Allergies:   Patient has no known allergies.   Social History   Socioeconomic History   Marital status: Married    Spouse name: Not on file   Number of children: 2   Years of education: Not on file   Highest education level: Not on file  Occupational History   Not on file  Tobacco Use   Smoking status: Never   Smokeless tobacco: Never  Vaping Use   Vaping Use: Never used  Substance and Sexual Activity   Alcohol use: Not Currently   Drug use: Never   Sexual activity: Yes    Birth control/protection: Pill  Other Topics Concern   Not on file  Social History Narrative   Not on file   Social Determinants of Health   Financial Resource Strain: Not on file  Food Insecurity: Unknown (03/15/2021)   Hunger Vital Sign    Worried About Running Out of Food in the Last Year: Never true    Ran Out of Food in the Last Year: Not on file  Transportation Needs: Unknown (03/15/2021)   PRAPARE -  Administrator, Civil Service (Medical): No    Lack of Transportation (Non-Medical): Not on file  Physical Activity: Not on file  Stress: Not on file  Social Connections: Not on file      Family History  Problem Relation Age of Onset   Diabetes Mother    Hypertension Mother    Miscarriages / [place] Mother    Skin cancer Father    Colon polyps Father    Hypertension Father    Diabetes Maternal Grandfather    Skin cancer Maternal Grandfather    Diabetes Paternal Grandmother    CVA Paternal Grandmother    Dementia Maternal Aunt    Cervical cancer Paternal Aunt    Colon cancer Neg Hx    Liver disease Neg Hx    Esophageal cancer Neg Hx    Stomach cancer Neg Hx       ROS:   Please see the history of present illness.     All other systems reviewed and are negative.   Labs/EKG Reviewed:    EKG:   EKG is was not ordered today.    Recent Labs: 03/30/2022: TSH 1.450   Recent Lipid Panel No results found for: "CHOL", "TRIG", "HDL", "CHOLHDL", "LDLCALC", "LDLDIRECT"  Physical Exam:    VS:  BP 118/88   Ht 5\' 8"  (1.727 m)   Wt 190 lb 6.4 oz (86.4 kg)   LMP 06/14/2022   BMI 28.95 kg/m     Wt Readings from Last 3 Encounters:  02/15/23 190 lb 6.4 oz (86.4 kg)  12/27/22 188 lb 6.4 oz (85.5 kg)  09/29/22 181 lb 3.2 oz (82.2 kg)     GEN:  Well nourished, well developed in no acute distress HEENT: Normal NECK: No JVD; No carotid bruits LYMPHATICS: No lymphadenopathy CARDIAC: RRR, no murmurs, rubs, gallops RESPIRATORY:  Clear to auscultation without rales, wheezing or rhonchi  ABDOMEN: Soft, non-tender, non-distended MUSCULOSKELETAL:  No edema; No deformity  SKIN: Warm and dry NEUROLOGIC:  Alert and oriented x 3 PSYCHIATRIC:  Normal affect    Risk Assessment/Risk Calculators:  ASSESSMENT & PLAN:    Chronic hypertension pregnancy-her blood pressure is on the lower side.  She feels lightheaded.  Will cut back on her carvedilol  to 6.25 mg twice daily.  Her systolic blood pressure today is in the 90s. She is going to monitor her blood pressure closely and make sure that if it starts to go up we will adjust her Coreg as appropriate.  Continue aspirin for preeclampsia prophylaxis.  I am excited for the patient.  The patient is in agreement with the above plan. The patient left the office in stable condition.  The patient will follow up in 8 weeks or sooner if needed.  Patient Instructions  Medication Instructions:  Your physician recommends that you continue on your current medications as directed. Please refer to the Current Medication list given to you today.  *If you need a refill on your cardiac medications before your next appointment, please call your pharmacy*   Lab Work: None   Testing/Procedures: None   Follow-Up: At [facility], you and your health needs are our priority.  As part of our continuing mission to provide you with exceptional heart care, we have created designated Provider Care Teams.  These Care Teams include your primary Cardiologist (physician) and Advanced Practice Providers (APPs -  Physician Assistants and Nurse Practitioners) who all work together to provide you with the care you need, when you need it.  Your next appointment:   May 28th doublebook  Provider:   Thomasene Ripple, DO    Dispo:  No follow-ups on file.   Medication Adjustments/Labs and Tests Ordered: Current medicines are reviewed at length with the patient today.  Concerns regarding medicines are outlined above.  Tests Ordered: No orders of the defined types were placed in this encounter.  Medication Changes: No orders of the defined types were placed in this encounter.

## 2023-02-15 NOTE — Patient Instructions (Signed)
Medication Instructions:  Your physician recommends that you continue on your current medications as directed. Please refer to the Current Medication list given to you today.  *If you need a refill on your cardiac medications before your next appointment, please call your pharmacy*   Lab Work: None   Testing/Procedures: None   Follow-Up: At [facility], you and your health needs are our priority.  As part of our continuing mission to provide you with exceptional heart care, we have created designated Provider Care Teams.  These Care Teams include your primary Cardiologist (physician) and Advanced Practice Providers (APPs -  Physician Assistants and Nurse Practitioners) who all work together to provide you with the care you need, when you need it.  Your next appointment:   May 28th doublebook  Provider:   Thomasene Ripple, DO

## 2023-02-22 ENCOUNTER — Encounter: Payer: Self-pay | Admitting: Physical Therapy

## 2023-02-22 ENCOUNTER — Other Ambulatory Visit: Payer: Self-pay | Admitting: *Deleted

## 2023-02-22 ENCOUNTER — Ambulatory Visit: Payer: BC Managed Care – PPO | Admitting: *Deleted

## 2023-02-22 ENCOUNTER — Ambulatory Visit: Payer: BC Managed Care – PPO | Attending: Obstetrics

## 2023-02-22 VITALS — BP 114/76 | HR 79

## 2023-02-22 DIAGNOSIS — O09293 Supervision of pregnancy with other poor reproductive or obstetric history, third trimester: Secondary | ICD-10-CM | POA: Diagnosis not present

## 2023-02-22 DIAGNOSIS — Z3A35 35 weeks gestation of pregnancy: Secondary | ICD-10-CM | POA: Diagnosis not present

## 2023-02-22 DIAGNOSIS — O10919 Unspecified pre-existing hypertension complicating pregnancy, unspecified trimester: Secondary | ICD-10-CM | POA: Diagnosis not present

## 2023-02-22 DIAGNOSIS — O10013 Pre-existing essential hypertension complicating pregnancy, third trimester: Secondary | ICD-10-CM

## 2023-02-22 DIAGNOSIS — Z9889 Other specified postprocedural states: Secondary | ICD-10-CM

## 2023-02-22 DIAGNOSIS — O10913 Unspecified pre-existing hypertension complicating pregnancy, third trimester: Secondary | ICD-10-CM

## 2023-02-22 DIAGNOSIS — O09523 Supervision of elderly multigravida, third trimester: Secondary | ICD-10-CM

## 2023-02-22 DIAGNOSIS — Z3689 Encounter for other specified antenatal screening: Secondary | ICD-10-CM

## 2023-02-22 DIAGNOSIS — Z148 Genetic carrier of other disease: Secondary | ICD-10-CM

## 2023-02-27 ENCOUNTER — Ambulatory Visit: Payer: BC Managed Care – PPO | Admitting: Physical Therapy

## 2023-02-28 ENCOUNTER — Other Ambulatory Visit: Payer: Self-pay | Admitting: Obstetrics and Gynecology

## 2023-03-01 ENCOUNTER — Ambulatory Visit: Payer: BC Managed Care – PPO | Admitting: *Deleted

## 2023-03-01 ENCOUNTER — Ambulatory Visit: Payer: BC Managed Care – PPO | Attending: Obstetrics

## 2023-03-01 VITALS — BP 125/73 | HR 81

## 2023-03-01 DIAGNOSIS — O09293 Supervision of pregnancy with other poor reproductive or obstetric history, third trimester: Secondary | ICD-10-CM

## 2023-03-01 DIAGNOSIS — O10913 Unspecified pre-existing hypertension complicating pregnancy, third trimester: Secondary | ICD-10-CM | POA: Diagnosis not present

## 2023-03-01 DIAGNOSIS — O10919 Unspecified pre-existing hypertension complicating pregnancy, unspecified trimester: Secondary | ICD-10-CM | POA: Diagnosis not present

## 2023-03-01 DIAGNOSIS — O10013 Pre-existing essential hypertension complicating pregnancy, third trimester: Secondary | ICD-10-CM | POA: Diagnosis not present

## 2023-03-01 DIAGNOSIS — O09523 Supervision of elderly multigravida, third trimester: Secondary | ICD-10-CM | POA: Diagnosis not present

## 2023-03-01 DIAGNOSIS — Z9889 Other specified postprocedural states: Secondary | ICD-10-CM

## 2023-03-01 DIAGNOSIS — Z3A36 36 weeks gestation of pregnancy: Secondary | ICD-10-CM

## 2023-03-01 DIAGNOSIS — O285 Abnormal chromosomal and genetic finding on antenatal screening of mother: Secondary | ICD-10-CM

## 2023-03-01 DIAGNOSIS — Z148 Genetic carrier of other disease: Secondary | ICD-10-CM

## 2023-03-02 DIAGNOSIS — Z3480 Encounter for supervision of other normal pregnancy, unspecified trimester: Secondary | ICD-10-CM | POA: Diagnosis not present

## 2023-03-02 DIAGNOSIS — O99019 Anemia complicating pregnancy, unspecified trimester: Secondary | ICD-10-CM | POA: Diagnosis not present

## 2023-03-02 DIAGNOSIS — Z369 Encounter for antenatal screening, unspecified: Secondary | ICD-10-CM | POA: Diagnosis not present

## 2023-03-02 LAB — OB RESULTS CONSOLE GBS: GBS: NEGATIVE

## 2023-03-03 ENCOUNTER — Telehealth (HOSPITAL_COMMUNITY): Payer: Self-pay | Admitting: *Deleted

## 2023-03-03 NOTE — Telephone Encounter (Signed)
Preadmission screen  

## 2023-03-07 ENCOUNTER — Encounter: Payer: Self-pay | Admitting: *Deleted

## 2023-03-08 ENCOUNTER — Ambulatory Visit: Payer: BC Managed Care – PPO | Attending: Obstetrics

## 2023-03-08 ENCOUNTER — Ambulatory Visit: Payer: BC Managed Care – PPO | Admitting: *Deleted

## 2023-03-08 DIAGNOSIS — O10919 Unspecified pre-existing hypertension complicating pregnancy, unspecified trimester: Secondary | ICD-10-CM | POA: Diagnosis not present

## 2023-03-08 DIAGNOSIS — Z148 Genetic carrier of other disease: Secondary | ICD-10-CM

## 2023-03-08 DIAGNOSIS — O10013 Pre-existing essential hypertension complicating pregnancy, third trimester: Secondary | ICD-10-CM | POA: Diagnosis not present

## 2023-03-08 DIAGNOSIS — O09293 Supervision of pregnancy with other poor reproductive or obstetric history, third trimester: Secondary | ICD-10-CM | POA: Insufficient documentation

## 2023-03-08 DIAGNOSIS — O09523 Supervision of elderly multigravida, third trimester: Secondary | ICD-10-CM | POA: Insufficient documentation

## 2023-03-08 DIAGNOSIS — O285 Abnormal chromosomal and genetic finding on antenatal screening of mother: Secondary | ICD-10-CM | POA: Diagnosis not present

## 2023-03-08 DIAGNOSIS — Z9889 Other specified postprocedural states: Secondary | ICD-10-CM

## 2023-03-08 DIAGNOSIS — Z3A37 37 weeks gestation of pregnancy: Secondary | ICD-10-CM

## 2023-03-09 DIAGNOSIS — Z369 Encounter for antenatal screening, unspecified: Secondary | ICD-10-CM | POA: Diagnosis not present

## 2023-03-14 ENCOUNTER — Encounter (HOSPITAL_COMMUNITY): Payer: Self-pay

## 2023-03-15 ENCOUNTER — Encounter (HOSPITAL_COMMUNITY): Payer: Self-pay | Admitting: *Deleted

## 2023-03-15 ENCOUNTER — Ambulatory Visit (HOSPITAL_BASED_OUTPATIENT_CLINIC_OR_DEPARTMENT_OTHER): Payer: BC Managed Care – PPO

## 2023-03-15 ENCOUNTER — Ambulatory Visit: Payer: BC Managed Care – PPO

## 2023-03-15 ENCOUNTER — Telehealth (HOSPITAL_COMMUNITY): Payer: Self-pay | Admitting: *Deleted

## 2023-03-15 VITALS — BP 120/82 | HR 72

## 2023-03-15 DIAGNOSIS — Z9889 Other specified postprocedural states: Secondary | ICD-10-CM

## 2023-03-15 DIAGNOSIS — O1092 Unspecified pre-existing hypertension complicating childbirth: Secondary | ICD-10-CM | POA: Diagnosis not present

## 2023-03-15 DIAGNOSIS — O09293 Supervision of pregnancy with other poor reproductive or obstetric history, third trimester: Secondary | ICD-10-CM | POA: Diagnosis not present

## 2023-03-15 DIAGNOSIS — Z148 Genetic carrier of other disease: Secondary | ICD-10-CM

## 2023-03-15 DIAGNOSIS — Z3A38 38 weeks gestation of pregnancy: Secondary | ICD-10-CM

## 2023-03-15 DIAGNOSIS — O09523 Supervision of elderly multigravida, third trimester: Secondary | ICD-10-CM

## 2023-03-15 DIAGNOSIS — O10913 Unspecified pre-existing hypertension complicating pregnancy, third trimester: Secondary | ICD-10-CM

## 2023-03-15 DIAGNOSIS — O285 Abnormal chromosomal and genetic finding on antenatal screening of mother: Secondary | ICD-10-CM | POA: Diagnosis not present

## 2023-03-15 DIAGNOSIS — Z3689 Encounter for other specified antenatal screening: Secondary | ICD-10-CM | POA: Insufficient documentation

## 2023-03-15 DIAGNOSIS — O10013 Pre-existing essential hypertension complicating pregnancy, third trimester: Secondary | ICD-10-CM | POA: Diagnosis not present

## 2023-03-15 DIAGNOSIS — Z3A39 39 weeks gestation of pregnancy: Secondary | ICD-10-CM | POA: Diagnosis not present

## 2023-03-15 DIAGNOSIS — O9902 Anemia complicating childbirth: Secondary | ICD-10-CM | POA: Diagnosis not present

## 2023-03-15 DIAGNOSIS — O26893 Other specified pregnancy related conditions, third trimester: Secondary | ICD-10-CM | POA: Diagnosis not present

## 2023-03-15 NOTE — Telephone Encounter (Signed)
Preadmission screen  

## 2023-03-16 ENCOUNTER — Inpatient Hospital Stay (HOSPITAL_COMMUNITY): Payer: BC Managed Care – PPO | Admitting: Anesthesiology

## 2023-03-16 ENCOUNTER — Other Ambulatory Visit: Payer: Self-pay

## 2023-03-16 ENCOUNTER — Encounter (HOSPITAL_COMMUNITY): Payer: Self-pay | Admitting: Obstetrics and Gynecology

## 2023-03-16 ENCOUNTER — Inpatient Hospital Stay (HOSPITAL_COMMUNITY)
Admission: AD | Admit: 2023-03-16 | Discharge: 2023-03-18 | DRG: 807 | Disposition: A | Discharge status: Home / Self Care | Payer: BC Managed Care – PPO | Attending: Obstetrics and Gynecology | Admitting: Obstetrics and Gynecology

## 2023-03-16 DIAGNOSIS — O9902 Anemia complicating childbirth: Secondary | ICD-10-CM | POA: Diagnosis present

## 2023-03-16 DIAGNOSIS — Z3A39 39 weeks gestation of pregnancy: Secondary | ICD-10-CM

## 2023-03-16 DIAGNOSIS — O1092 Unspecified pre-existing hypertension complicating childbirth: Principal | ICD-10-CM | POA: Diagnosis present

## 2023-03-16 DIAGNOSIS — O26893 Other specified pregnancy related conditions, third trimester: Secondary | ICD-10-CM | POA: Diagnosis present

## 2023-03-16 DIAGNOSIS — O10919 Unspecified pre-existing hypertension complicating pregnancy, unspecified trimester: Secondary | ICD-10-CM | POA: Diagnosis present

## 2023-03-16 LAB — TYPE AND SCREEN
ABO/RH(D): O POS
Antibody Screen: NEGATIVE

## 2023-03-16 LAB — POCT FERN TEST: POCT Fern Test: POSITIVE

## 2023-03-16 LAB — HEPATIC FUNCTION PANEL
ALT: 11 U/L (ref 0–44)
AST: 15 U/L (ref 15–41)
Albumin: 2.7 g/dL — ABNORMAL LOW (ref 3.5–5.0)
Alkaline Phosphatase: 125 U/L (ref 38–126)
Bilirubin, Direct: <0.1 mg/dL (ref 0.0–0.2)
Total Bilirubin: 0.4 mg/dL (ref 0.3–1.2)
Total Protein: 6.4 g/dL — ABNORMAL LOW (ref 6.5–8.1)

## 2023-03-16 LAB — CBC
HCT: 29.7 % — ABNORMAL LOW (ref 36.0–46.0)
Hemoglobin: 9.3 g/dL — ABNORMAL LOW (ref 12.0–15.0)
MCH: 26.9 pg (ref 26.0–34.0)
MCHC: 31.3 g/dL (ref 30.0–36.0)
MCV: 85.8 fL (ref 80.0–100.0)
Platelets: 327 10*3/uL (ref 150–400)
RBC: 3.46 MIL/uL — ABNORMAL LOW (ref 3.87–5.11)
RDW: 13.8 % (ref 11.5–15.5)
WBC: 9.7 10*3/uL (ref 4.0–10.5)
nRBC: 0 % (ref 0.0–0.2)

## 2023-03-16 LAB — PROTEIN / CREATININE RATIO, URINE
Creatinine, Urine: 18 mg/dL
Total Protein, Urine: <6 mg/dL

## 2023-03-16 MED ORDER — OXYTOCIN-SODIUM CHLORIDE 30-0.9 UT/500ML-% IV SOLN: 2.5000 [IU]/h | INTRAVENOUS | Status: DC

## 2023-03-16 MED ORDER — LACTATED RINGERS IV SOLN: 500.0000 mL | INTRAVENOUS | Status: DC | PRN

## 2023-03-16 MED ORDER — LIDOCAINE-EPINEPHRINE (PF) 2 %-1:200000 IJ SOLN
INTRAMUSCULAR | Status: DC | PRN
  Administered 2023-03-16: 5 mL via EPIDURAL

## 2023-03-16 MED ORDER — TERBUTALINE SULFATE 1 MG/ML IJ SOLN: 0.2500 mg | Freq: Once | INTRAMUSCULAR | Status: DC | PRN

## 2023-03-16 MED ORDER — SOD CITRATE-CITRIC ACID 500-334 MG/5ML PO SOLN: 30.0000 mL | ORAL | Status: DC | PRN

## 2023-03-16 MED ORDER — LACTATED RINGERS IV SOLN: INTRAVENOUS | Status: DC

## 2023-03-16 MED ORDER — OXYCODONE-ACETAMINOPHEN 5-325 MG PO TABS: 1.0000 | ORAL_TABLET | ORAL | Status: DC | PRN

## 2023-03-16 MED ORDER — OXYTOCIN-SODIUM CHLORIDE 30-0.9 UT/500ML-% IV SOLN
1.0000 m[IU]/min | INTRAVENOUS | Status: DC
  Administered 2023-03-17: 2 m[IU]/min via INTRAVENOUS
  Filled 2023-03-16: qty 500

## 2023-03-16 MED ORDER — ACETAMINOPHEN 325 MG PO TABS: 650.0000 mg | ORAL_TABLET | ORAL | Status: DC | PRN

## 2023-03-16 MED ORDER — OXYCODONE-ACETAMINOPHEN 5-325 MG PO TABS: 2.0000 | ORAL_TABLET | ORAL | Status: DC | PRN

## 2023-03-16 MED ORDER — LACTATED RINGERS IV SOLN: 500.0000 mL | Freq: Once | INTRAVENOUS | Status: DC

## 2023-03-16 MED ORDER — EPHEDRINE 5 MG/ML INJ: 10.0000 mg | INTRAVENOUS | Status: DC | PRN

## 2023-03-16 MED ORDER — DIPHENHYDRAMINE HCL 50 MG/ML IJ SOLN: 12.5000 mg | INTRAMUSCULAR | Status: DC | PRN

## 2023-03-16 MED ORDER — PHENYLEPHRINE 80 MCG/ML (10ML) SYRINGE FOR IV PUSH (FOR BLOOD PRESSURE SUPPORT): 80.0000 ug | PREFILLED_SYRINGE | INTRAVENOUS | Status: DC | PRN

## 2023-03-16 MED ORDER — LIDOCAINE HCL (PF) 1 % IJ SOLN: 30.0000 mL | INTRAMUSCULAR | Status: DC | PRN

## 2023-03-16 MED ORDER — ONDANSETRON HCL 4 MG/2ML IJ SOLN
4.0000 mg | Freq: Four times a day (QID) | INTRAMUSCULAR | Status: DC | PRN
  Administered 2023-03-17: 4 mg via INTRAVENOUS
  Filled 2023-03-16: qty 2

## 2023-03-16 MED ORDER — OXYTOCIN BOLUS FROM INFUSION: 333.0000 mL | Freq: Once | INTRAVENOUS | Status: DC

## 2023-03-16 MED ORDER — OXYTOCIN-SODIUM CHLORIDE 30-0.9 UT/500ML-% IV SOLN: 1.0000 m[IU]/min | INTRAVENOUS | Status: DC

## 2023-03-16 MED ORDER — ONDANSETRON HCL 4 MG/2ML IJ SOLN: 4.0000 mg | Freq: Four times a day (QID) | INTRAMUSCULAR | Status: DC | PRN

## 2023-03-16 MED ORDER — FENTANYL-BUPIVACAINE-NACL 0.5-0.125-0.9 MG/250ML-% EP SOLN
12.0000 mL/h | EPIDURAL | Status: DC | PRN
  Administered 2023-03-16: 12 mL/h via EPIDURAL
  Filled 2023-03-16: qty 250

## 2023-03-16 MED ORDER — FENTANYL CITRATE (PF) 100 MCG/2ML IJ SOLN: 50.0000 ug | INTRAMUSCULAR | Status: DC | PRN

## 2023-03-16 MED ORDER — OXYTOCIN BOLUS FROM INFUSION
333.0000 mL | Freq: Once | INTRAVENOUS | Status: AC
  Administered 2023-03-17: 333 mL via INTRAVENOUS

## 2023-03-16 NOTE — Anesthesia Procedure Notes (Signed)
Epidural Patient location during procedure: OB Start time: 03/16/2023 10:35 PM End time: 03/16/2023 10:45 PM  Staffing Anesthesiologist: Elmer Picker, MD Performed: anesthesiologist   Preanesthetic Checklist Completed: patient identified, IV checked, risks and benefits discussed, monitors and equipment checked, pre-op evaluation and timeout performed  Epidural Patient position: sitting Prep: DuraPrep and site prepped and draped Patient monitoring: continuous pulse ox, blood pressure, heart rate and cardiac monitor Approach: midline Location: L3-L4 Injection technique: LOR air  Needle:  Needle type: Tuohy  Needle gauge: 17 G Needle length: 9 cm Needle insertion depth: 6 cm Catheter type: closed end flexible Catheter size: 19 Gauge Catheter at skin depth: 11 cm Test dose: negative  Assessment Sensory level: T8 Events: blood not aspirated, no cerebrospinal fluid, injection not painful, no injection resistance, no paresthesia and negative IV test  Additional Notes Patient identified. Risks/Benefits/Options discussed with patient including but not limited to bleeding, infection, nerve damage, paralysis, failed block, incomplete pain control, headache, blood pressure changes, nausea, vomiting, reactions to medication both or allergic, itching and postpartum back pain. Confirmed with bedside nurse the patient's most recent platelet count. Confirmed with patient that they are not currently taking any anticoagulation, have any bleeding history or any family history of bleeding disorders. Patient expressed understanding and wished to proceed. All questions were answered. Sterile technique was used throughout the entire procedure. Please see nursing notes for vital signs. Test dose was given through epidural catheter and negative prior to continuing to dose epidural or start infusion. Warning signs of high block given to the patient including shortness of breath, tingling/numbness in  hands, complete motor block, or any concerning symptoms with instructions to call for help. Patient was given instructions on fall risk and not to get out of bed. All questions and concerns addressed with instructions to call with any issues or inadequate analgesia.  Reason for block:procedure for pain

## 2023-03-16 NOTE — H&P (Signed)
Alicia Nguyen is a 37 y.o.G69P2002 female presenting at 23 6/7wks with SROM and intermittent contractions Pt is dated per 7wk [place].  Her pregnancy has been complicated by hypertensive disorder- failed several meds and finally on carvidilol per Dr Servando Salina ( Also has hx of preE)  AMA Hyperemesis Anxiety  Anemia of pregnancy   Wants BTL if has to have c/s  OB History     Gravida  3   Para  2   Term  2   Preterm  0   AB  0   Living  2      SAB  0   IAB  0   Ectopic  0   Multiple  0   Live Births  2          Past Medical History:  Diagnosis Date   Acute bronchitis 11/06/2009   Qualifier: Diagnosis of  By: Gabriel Rung LPN, Harriett Sine     Advanced maternal age in multigravida, second trimester 03/15/2021   Anxiety 2010   Chronic hypertension affecting pregnancy 07/24/2021   Depression 11/14/2021   Post partum depression   Encounter for cardiovascular postpartum visit 08/03/2021   Gestational hypertension, third trimester 06/04/2021   History of pre-eclampsia 03/15/2021   Hypertension    Medication management 10/15/2021   Multigravida of advanced maternal age in third trimester 06/04/2021   Postpartum depression 05/30/2022   Preeclampsia    Pregnancy 01/06/2021   Pregnancy induced hypertension    Pregnant    Past Surgical History:  Procedure Laterality Date   BRAIN SURGERY  1989   Optic glioma removal   EYE SURGERY     Strabismus right eye   FOOT FRACTURE SURGERY     Optic gioma removal     REFRACTIVE SURGERY     Family History: family history includes CVA in her paternal grandmother; Cervical cancer in her paternal aunt; Colon polyps in her father; Dementia in her maternal aunt; Diabetes in her maternal grandfather, mother, and paternal grandmother; Hypertension in her father and mother; Miscarriages / Stillbirths in her mother; Skin cancer in her father and maternal grandfather. Social History:  reports that she has never smoked. She has never used smokeless  tobacco. She reports that she does not currently use alcohol. She reports that she does not use drugs.     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 Negative for fatigue.  Eyes:  Negative for photophobia and visual disturbance.  Respiratory:  Negative for chest tightness and shortness of breath.   Cardiovascular:  Positive for leg swelling. Negative for chest pain and palpitations.  Gastrointestinal:  Positive for abdominal pain.  Genitourinary:  Positive for pelvic pain.  Musculoskeletal:  Negative for back pain.  Neurological:  Negative for headaches.  Psychiatric/Behavioral:  The patient is not nervous/anxious.    Maternal Medical History:  Reason for admission: Rupture of membranes and contractions.   Contractions: Onset was 3-5 hours ago.   Frequency: irregular.   Perceived severity is mild.   Fetal activity: Perceived fetal activity is normal.   Prenatal complications: PIH.   Prenatal Complications - Diabetes: none.   Dilation: 4 Effacement (%): 70  Station: -2 Exam by:: Dr Mindi Slicker Blood pressure (!) 137/93, pulse 89, temperature (!) 97.3 F (36.3 C), temperature source Oral, resp. rate 18, height 5\' 8"  (1.727 m), weight 86.9 kg, last menstrual period 06/14/2022, SpO2 98 %, unknown if currently breastfeeding. Maternal Exam:  Uterine Assessment: Contraction strength is mild.  Contraction frequency is irregular.  Abdomen: Patient reports generalized tenderness.  Estimated fetal weight is AGA.   Fetal presentation: vertex Introitus: Normal vulva.  Vulva is negative for condylomata and lesion.  Normal vagina.  Vagina is negative for condylomata.  Amniotic fluid character: clear. Pelvis: adequate for delivery.   Cervix: Cervix evaluated by digital exam.     Fetal Exam Fetal Monitor Review: Baseline rate: 130.  Variability: moderate (6-25 bpm).   Pattern: accelerations present and no decelerations.   Fetal State Assessment: Category I - tracings are normal.   Physical Exam Vitals and nursing note reviewed. Exam conducted with a chaperone present.  Constitutional:      Appearance: Normal appearance. She is normal weight.  Cardiovascular:     Pulses: Normal pulses.  Pulmonary:     Effort: Pulmonary effort is normal.  Abdominal:     Tenderness: There is generalized abdominal tenderness.  Genitourinary:    General: Normal vulva.  Vulva is no lesion.  Musculoskeletal:        General: Normal range of motion.     Cervical back: Normal range of motion.  Skin:    General: Skin is warm.     Capillary Refill: Capillary refill takes 2 to 3 seconds.  Neurological:     General: No focal deficit present.     Mental Status: She is alert and oriented to person, place, and time. Mental status is at baseline.  Psychiatric:        Mood and Affect: Mood normal.        Behavior: Behavior normal.        Thought Content: Thought content normal.        Judgment: Judgment normal.     Prenatal labs: ABO, Rh: --/--/PENDING (05/16 1659) Antibody: PENDING (05/16 1659) Rubella: Immune (11/09 0000) RPR: Nonreactive (11/09 0000)  HBsAg: Negative (11/09 0000)  HIV: Non-reactive (11/09 0000)  GBS: Negative/-- (05/02 0000)   Assessment/Plan: 29FA [id number] female with SROM and latent labor - Admit  - Monitor and augmentin with pitocin if indicated - Pain control prn  - GBS neg    Alicia Nguyen 03/16/2023, 5:38 PM

## 2023-03-16 NOTE — Anesthesia Preprocedure Evaluation (Signed)
Anesthesia Evaluation  Patient identified by MRN, date of birth, ID band Patient awake    Reviewed: Allergy & Precautions, NPO status , Patient's Chart, lab work & pertinent test results, reviewed documented beta blocker date and time   Airway Mallampati: II  TM Distance: >3 FB Neck ROM: Full    Dental no notable dental hx.    Pulmonary neg pulmonary ROS   Pulmonary exam normal breath sounds clear to auscultation       Cardiovascular hypertension (cHTN), Pt. on home beta blockers and Pt. on medications Normal cardiovascular exam Rhythm:Regular Rate:Normal     Neuro/Psych  PSYCHIATRIC DISORDERS Anxiety Depression    negative neurological ROS     GI/Hepatic negative GI ROS, Neg liver ROS,,,  Endo/Other  negative endocrine ROS    Renal/GU negative Renal ROS  negative genitourinary   Musculoskeletal negative musculoskeletal ROS (+)    Abdominal   Peds  Hematology negative hematology ROS (+)   Anesthesia Other Findings Presents with SROM  Reproductive/Obstetrics (+) Pregnancy                             Anesthesia Physical Anesthesia Plan  ASA: 3  Anesthesia Plan: Epidural   Post-op Pain Management:    Induction:   PONV Risk Score and Plan: Treatment may vary due to age or medical condition  Airway Management Planned: Natural Airway  Additional Equipment:   Intra-op Plan:   Post-operative Plan:   Informed Consent: I have reviewed the patients History and Physical, chart, labs and discussed the procedure including the risks, benefits and alternatives for the proposed anesthesia with the patient or authorized representative who has indicated his/her understanding and acceptance.       Plan Discussed with: Anesthesiologist  Anesthesia Plan Comments: (Patient identified. Risks, benefits, options discussed with patient including but not limited to bleeding, infection, nerve  damage, paralysis, failed block, incomplete pain control, headache, blood pressure changes, nausea, vomiting, reactions to medication, itching, and post partum back pain. Confirmed with bedside nurse the patient's most recent platelet count. Confirmed with the patient that they are not taking any anticoagulation, have any bleeding history or any family history of bleeding disorders. Patient expressed understanding and wishes to proceed. All questions were answered. )       Anesthesia Quick Evaluation

## 2023-03-16 NOTE — MAU Note (Signed)
Pt informed that the ultrasound is considered a limited OB ultrasound and is not intended to be a complete ultrasound exam.  Patient also informed that the ultrasound is not being completed with the intent of assessing for fetal or placental anomalies or any pelvic abnormalities.  Explained that the purpose of today's ultrasound is to assess for presentation.  Patient acknowledges the purpose of the exam and the limitations of the study.    Vertex presentation confirmed 

## 2023-03-16 NOTE — MAU Note (Signed)
Alicia Nguyen is a 37 y.o. at [redacted]w[redacted]d here in MAU reporting: she's having ctxs that are 6 minutes apart and water broke @ 1500, reports fluid is clear.  Denies VB.  Endorses +FM. LMP: NA Onset of complaint: 1500 Pain score: 6 Vitals:   03/16/23 1615  BP: (!) 129/90  Pulse: 90  Resp: 18  Temp: (!) 97.3 F (36.3 C)  SpO2: 99%     FHT:120 bpm Lab orders placed from triage: None

## 2023-03-17 ENCOUNTER — Inpatient Hospital Stay (HOSPITAL_COMMUNITY): Payer: BC Managed Care – PPO

## 2023-03-17 ENCOUNTER — Inpatient Hospital Stay (HOSPITAL_COMMUNITY)
Admission: AD | Admit: 2023-03-17 | Payer: BC Managed Care – PPO | Source: Home / Self Care | Admitting: Obstetrics and Gynecology

## 2023-03-17 ENCOUNTER — Encounter (HOSPITAL_COMMUNITY): Payer: Self-pay | Admitting: Obstetrics and Gynecology

## 2023-03-17 LAB — CBC WITH DIFFERENTIAL/PLATELET
Abs Immature Granulocytes: 0.04 10*3/uL (ref 0.00–0.07)
Basophils Absolute: 0 10*3/uL (ref 0.0–0.1)
Basophils Relative: 0 %
Eosinophils Absolute: 0 10*3/uL (ref 0.0–0.5)
Eosinophils Relative: 0 %
HCT: 28.8 % — ABNORMAL LOW (ref 36.0–46.0)
Hemoglobin: 9.1 g/dL — ABNORMAL LOW (ref 12.0–15.0)
Immature Granulocytes: 0 %
Lymphocytes Relative: 9 %
Lymphs Abs: 1.2 10*3/uL (ref 0.7–4.0)
MCH: 27.1 pg (ref 26.0–34.0)
MCHC: 31.6 g/dL (ref 30.0–36.0)
MCV: 85.7 fL (ref 80.0–100.0)
Monocytes Absolute: 0.9 10*3/uL (ref 0.1–1.0)
Monocytes Relative: 7 %
Neutro Abs: 11.1 10*3/uL — ABNORMAL HIGH (ref 1.7–7.7)
Neutrophils Relative %: 84 %
Platelets: 259 10*3/uL (ref 150–400)
RBC: 3.36 MIL/uL — ABNORMAL LOW (ref 3.87–5.11)
RDW: 13.8 % (ref 11.5–15.5)
WBC: 13.2 10*3/uL — ABNORMAL HIGH (ref 4.0–10.5)
nRBC: 0 % (ref 0.0–0.2)

## 2023-03-17 LAB — RPR: RPR Ser Ql: NONREACTIVE

## 2023-03-17 MED ORDER — PRENATAL MULTIVITAMIN CH
1.0000 | ORAL_TABLET | Freq: Every day | ORAL | Status: DC
  Administered 2023-03-17 – 2023-03-18 (×2): 1 via ORAL
  Filled 2023-03-17 (×2): qty 1

## 2023-03-17 MED ORDER — TERBUTALINE SULFATE 1 MG/ML IJ SOLN: 0.2500 mg | Freq: Once | INTRAMUSCULAR | Status: DC | PRN

## 2023-03-17 MED ORDER — ZOLPIDEM TARTRATE 5 MG PO TABS: 5.0000 mg | ORAL_TABLET | Freq: Every evening | ORAL | Status: DC | PRN

## 2023-03-17 MED ORDER — MEASLES, MUMPS & RUBELLA VAC IJ SOLR: 0.5000 mL | Freq: Once | INTRAMUSCULAR | Status: DC

## 2023-03-17 MED ORDER — SIMETHICONE 80 MG PO CHEW: 80.0000 mg | CHEWABLE_TABLET | ORAL | Status: DC | PRN

## 2023-03-17 MED ORDER — TETANUS-DIPHTH-ACELL PERTUSSIS 5-2.5-18.5 LF-MCG/0.5 IM SUSY: 0.5000 mL | PREFILLED_SYRINGE | Freq: Once | INTRAMUSCULAR | Status: DC

## 2023-03-17 MED ORDER — OXYCODONE HCL 5 MG PO TABS: 10.0000 mg | ORAL_TABLET | ORAL | Status: DC | PRN

## 2023-03-17 MED ORDER — METHYLERGONOVINE MALEATE 0.2 MG PO TABS: 0.2000 mg | ORAL_TABLET | ORAL | Status: DC | PRN

## 2023-03-17 MED ORDER — MAGNESIUM HYDROXIDE 400 MG/5ML PO SUSP: 30.0000 mL | ORAL | Status: DC | PRN

## 2023-03-17 MED ORDER — METHYLERGONOVINE MALEATE 0.2 MG/ML IJ SOLN: 0.2000 mg | INTRAMUSCULAR | Status: DC | PRN

## 2023-03-17 MED ORDER — CARVEDILOL 6.25 MG PO TABS
6.2500 mg | ORAL_TABLET | Freq: Two times a day (BID) | ORAL | Status: DC
  Administered 2023-03-17 – 2023-03-18 (×3): 6.25 mg via ORAL
  Filled 2023-03-17 (×3): qty 1

## 2023-03-17 MED ORDER — COCONUT OIL OIL: 1.0000 | TOPICAL_OIL | Status: DC | PRN

## 2023-03-17 MED ORDER — OXYCODONE HCL 5 MG PO TABS: 5.0000 mg | ORAL_TABLET | ORAL | Status: DC | PRN

## 2023-03-17 MED ORDER — BENZOCAINE-MENTHOL 20-0.5 % EX AERO
1.0000 | INHALATION_SPRAY | CUTANEOUS | Status: DC | PRN
  Administered 2023-03-17: 1 via TOPICAL
  Filled 2023-03-17: qty 56

## 2023-03-17 MED ORDER — ONDANSETRON HCL 4 MG/2ML IJ SOLN: 4.0000 mg | INTRAMUSCULAR | Status: DC | PRN

## 2023-03-17 MED ORDER — ONDANSETRON HCL 4 MG PO TABS: 4.0000 mg | ORAL_TABLET | ORAL | Status: DC | PRN

## 2023-03-17 MED ORDER — SENNOSIDES-DOCUSATE SODIUM 8.6-50 MG PO TABS
2.0000 | ORAL_TABLET | Freq: Every day | ORAL | Status: DC
  Administered 2023-03-18: 2 via ORAL
  Filled 2023-03-17: qty 2

## 2023-03-17 MED ORDER — DIBUCAINE (PERIANAL) 1 % EX OINT: 1.0000 | TOPICAL_OINTMENT | CUTANEOUS | Status: DC | PRN

## 2023-03-17 MED ORDER — WITCH HAZEL-GLYCERIN EX PADS: 1.0000 | MEDICATED_PAD | CUTANEOUS | Status: DC | PRN

## 2023-03-17 MED ORDER — IBUPROFEN 600 MG PO TABS
600.0000 mg | ORAL_TABLET | Freq: Four times a day (QID) | ORAL | Status: DC
  Administered 2023-03-17 – 2023-03-18 (×5): 600 mg via ORAL
  Filled 2023-03-17 (×6): qty 1

## 2023-03-17 MED ORDER — SERTRALINE HCL 50 MG PO TABS
50.0000 mg | ORAL_TABLET | Freq: Every day | ORAL | Status: DC
  Administered 2023-03-17 – 2023-03-18 (×2): 50 mg via ORAL
  Filled 2023-03-17 (×2): qty 1

## 2023-03-17 MED ORDER — DIPHENHYDRAMINE HCL 25 MG PO CAPS: 25.0000 mg | ORAL_CAPSULE | Freq: Four times a day (QID) | ORAL | Status: DC | PRN

## 2023-03-17 MED ORDER — ACETAMINOPHEN 325 MG PO TABS
650.0000 mg | ORAL_TABLET | ORAL | Status: DC | PRN
  Administered 2023-03-18: 650 mg via ORAL
  Filled 2023-03-17: qty 2

## 2023-03-17 NOTE — Progress Notes (Signed)
Post Partum Day 0 Subjective: no complaints, up ad lib, voiding, and tolerating PO  Objective: Blood pressure 121/80, pulse 74, temperature 98.3 F (36.8 C), temperature source Oral, resp. rate 18, height 5\' 8"  (1.727 m), weight 86.9 kg, last menstrual period 06/14/2022, SpO2 98 %, unknown if currently breastfeeding.  Physical Exam:  General: alert, cooperative, and appears stated age Lochia: appropriate Uterine Fundus: firm DVT Evaluation: No evidence of DVT seen on physical exam.  Recent Labs    03/16/23 1659 03/17/23 0539  HGB 9.3* 9.1*  HCT 29.7* 28.8*    Assessment/Plan: Routine postpartum care Declined circumcision Breast-feeding Anticipate discharge home tomorrow   LOS: 1 day   Waynard Reeds, MD 03/17/2023, 10:52 AM

## 2023-03-17 NOTE — Lactation Note (Signed)
This note was copied from a baby's chart. Lactation Consultation Note  Patient Name: Alicia Nguyen [id number] Date: 03/17/2023 Age:38 hours Reason for consult: Initial assessment;Term  Initial visit to 6 hours old infant. Birthing parent states infant is very sleepy and will not latch. Demonstrated hand expression, collected ~1-mL of colostrum and spoonfed. Provided hand pump for colostrum expression, collected ~1-mL. Infant is very sleepy and unable to latch after several attempts.  Discussed normal newborn behavior and patterns, signs of good milk transfer, hunger cues, tummy size and benefits of skin to skin.   Plan: 1-Breastfeeding on demand or 8-12 times in 24h period. 2-Use manual pump as needed 3-Encouraged birthing parent rest, hydration and food intake.   Contact LC as needed for feeds/support/concerns/questions. All questions answered at this time. Provided Lactation services brochure.    Maternal Data Has patient been taught Hand Expression?: Yes Does the patient have breastfeeding experience prior to this delivery?: Yes How long did the patient breastfeed?: 1st child, 6 months; 2nd child, 2 weeks-pumping  Feeding Mother's Current Feeding Choice: Breast Milk  LATCH Score Latch: Too sleepy or reluctant, no latch achieved, no sucking elicited.  Audible Swallowing: None  Type of Nipple: Everted at rest and after stimulation  Comfort (Breast/Nipple): Soft / non-tender  Hold (Positioning): Assistance needed to correctly position infant at breast and maintain latch.  LATCH Score: 5   Lactation Tools Discussed/Used Tools: Pump;Flanges Flange Size: 27 Breast pump type: Manual Pump Education: Setup, frequency, and cleaning;Milk Storage Reason for Pumping: stimulation and supplementation Pumping frequency: as needed Pumped volume: 1 mL (with demonstration)  Interventions Interventions: Breast feeding basics reviewed;Assisted with latch;Skin to skin;Breast massage;Hand  express;Hand pump;Expressed milk;Education;LC Services brochure  Discharge Pump: Manual;Personal (spectra) WIC Program: No  Consult Status Consult Status: Follow-up Date: 03/18/23 Follow-up type: Call as needed    Alicia Nguyen 03/17/2023, 11:12 AM

## 2023-03-17 NOTE — Progress Notes (Signed)
Comfortable with epidural Afeb, VSS FHT 120-130, had been Cat 1, recent small variable decels, then a 7 minute prolonged decel that spontaneously recovered, ctx q 2-4 min on 6 mu/min pitocin that was turned off with prolonged decel VE-9/90/0, +1 with ctx, vtx, FSE applied  Making progress in active labor with pitocin augmentation, pitocin turned off during prolonged decel, will restart at 2 mu/min as FHT has recovered.  Monitor progress, anticipate pushing soon

## 2023-03-18 MED ORDER — IBUPROFEN 600 MG PO TABS: 600.0000 mg | ORAL_TABLET | Freq: Four times a day (QID) | ORAL | 1 refills | Status: DC | PRN

## 2023-03-18 MED ORDER — IBUPROFEN 600 MG PO TABS: 600.0000 mg | ORAL_TABLET | Freq: Four times a day (QID) | ORAL | 0 refills | Status: DC | PRN

## 2023-03-18 NOTE — Progress Notes (Addendum)
MOB was referred for history of postpartum depression/anxiety. * Referral screened out by Clinical Social Worker because none of the following criteria appear to apply: ~ History of anxiety/depression during this pregnancy, or of post-partum depression following prior delivery. ~ Diagnosis of anxiety and/or depression within last 3 years OR * MOB's symptoms currently being treated with medication and/or therapy. MOB has an active prescription for sertraline 50mg daily. Edinburgh Postnatal Depression Scale score of 2 during current admission.  Please contact the Clinical Social Worker if needs arise, by MOB request, or if MOB scores greater than 9/yes to question 10 on Edinburgh Postpartum Depression Screen.  Signed,  Galdino Hinchman K. Jozlin Bently, MSW, LCSWA, LCASA 03/18/2023 9:51 AM   

## 2023-03-18 NOTE — Anesthesia Postprocedure Evaluation (Signed)
Anesthesia Post Note  Patient: Alicia Nguyen  Procedure(s) Performed: AN AD HOC LABOR EPIDURAL     Patient location during evaluation: Mother Baby Anesthesia Type: Epidural Level of consciousness: awake, oriented and awake and alert Pain management: pain level controlled Vital Signs Assessment: post-procedure vital signs reviewed and stable Respiratory status: spontaneous breathing, respiratory function stable and nonlabored ventilation Cardiovascular status: stable Postop Assessment: no headache, adequate PO intake, able to ambulate, patient able to bend at knees and no apparent nausea or vomiting Anesthetic complications: no   No notable events documented.  Last Vitals:  Vitals:   03/17/23 2041 03/18/23 0606  BP: 130/83 123/83  Pulse: 76 80  Resp: 18 17  Temp: 36.8 C 36.7 C  SpO2: 96% 97%    Last Pain:  Vitals:   03/18/23 0758  TempSrc:   PainSc: 0-No pain   Pain Goal:                Epidural/Spinal Function Cutaneous sensation: Normal sensation (03/18/23 0758), Patient able to flex knees: Yes (03/18/23 0758), Patient able to lift hips off bed: Yes (03/18/23 0758), Back pain beyond tenderness at insertion site: No (03/18/23 0758), Progressively worsening motor and/or sensory loss: No (03/18/23 0758), Bowel and/or bladder incontinence post epidural: No (03/18/23 0758)  Meshulem Onorato

## 2023-03-18 NOTE — Progress Notes (Signed)
Post Partum Day 1 Subjective: no complaints, up ad lib, voiding, tolerating PO, + flatus, and lochia mild. She denies lightheadedness, SOB, CP. She is bonding well with baby and breastfeeding. She would like discharge home today  Objective: Blood pressure 123/83, pulse 80, temperature 98.1 F (36.7 C), temperature source Oral, resp. rate 17, height 5\' 8"  (1.727 m), weight 86.9 kg, last menstrual period 06/14/2022, SpO2 97 %, unknown if currently breastfeeding.  Physical Exam:  General: alert, cooperative, and no distress Lochia: appropriate Uterine Fundus: firm Incision: n/a DVT Evaluation: No evidence of DVT seen on physical exam.  Recent Labs    03/16/23 1659 03/17/23 0539  HGB 9.3* 9.1*  HCT 29.7* 28.8*    Assessment/Plan: Discharge home and Breastfeeding Instructions reviewed    LOS: 2 days   Karlon Schlafer W Arzell Mcgeehan, DO 03/18/2023, 10:20 AM

## 2023-03-18 NOTE — Discharge Instructions (Signed)
Call office with any concerns [phone] 

## 2023-03-18 NOTE — Discharge Summary (Signed)
Postpartum Discharge Summary  Date of Service updated      Patient Name: Alicia Nguyen DOB: Mar 13, 1986 MRN: [id number]  Date of admission: 03/16/2023 Delivery date:03/17/2023  Delivering provider: Jackelyn Knife, TODD  Date of discharge: 03/18/2023  Admitting diagnosis: Chronic hypertension affecting pregnancy [O10.919] Indication for care in labor or delivery [O75.9] Intrauterine pregnancy: [redacted]w[redacted]d     Secondary diagnosis:  Active Problems:   Chronic hypertension affecting pregnancy Anemia of pregnancy  Additional problems: none    Discharge diagnosis: Term Pregnancy Delivered and CHTN    and anemia of pregnancy                                          Post partum procedures: n/a Augmentation: Pitocin Complications: None  Hospital course: Onset of Labor With Vaginal Delivery      37 y.o. yo G3P3003 at [redacted]w[redacted]d was admitted in Active Labor on 03/16/2023. Labor course was complicated by n/a  Membrane Rupture Time/Date: 3:00 PM ,03/16/2023   Delivery Method:Vaginal, Spontaneous  Episiotomy: None  Lacerations:  1st degree  Patient had a postpartum course complicated by n/a.  She is ambulating, tolerating a regular diet, passing flatus, and urinating well. Patient is discharged home in stable condition on 03/18/23.  Newborn Data: Birth date:03/17/2023  Birth time:4:28 AM  Gender:Female  Living status:Living  Apgars:9 ,9  Weight:3590 g   Magnesium Sulfate received: No BMZ received: No  Physical exam  Vitals:   03/17/23 0730 03/17/23 1130 03/17/23 2041 03/18/23 0606  BP: 121/80 117/74 130/83 123/83  Pulse: 74 73 76 80  Resp: 18 18 18 17   Temp: 98.3 F (36.8 C) 97.9 F (36.6 C) 98.2 F (36.8 C) 98.1 F (36.7 C)  TempSrc: Oral Oral Oral Oral  SpO2: 98% 97% 96% 97%  Weight:      Height:       General: alert, cooperative, and no distress Lochia: appropriate Uterine Fundus: firm Incision: N/A DVT Evaluation: No evidence of DVT seen on physical exam. Labs: Lab Results   Component Value Date   WBC 13.2 (H) 03/17/2023   HGB 9.1 (L) 03/17/2023   HCT 28.8 (L) 03/17/2023   MCV 85.7 03/17/2023   PLT 259 03/17/2023      Latest Ref Rng & Units 03/16/2023    6:37 PM  CMP  Total Protein 6.5 - 8.1 g/dL 6.4   Total Bilirubin 0.3 - 1.2 mg/dL 0.4   Alkaline Phos 38 - 126 U/L 125   AST 15 - 41 U/L 15   ALT 0 - 44 U/L 11    Edinburgh Score:    03/17/2023    6:30 AM  Edinburgh Postnatal Depression Scale Screening Tool  I have been able to laugh and see the funny side of things. 0  I have looked forward with enjoyment to things. 0  I have blamed myself unnecessarily when things went wrong. 1  I have been anxious or worried for no good reason. 1  I have felt scared or panicky for no good reason. 0  Things have been getting on top of me. 0  I have been so unhappy that I have had difficulty sleeping. 0  I have felt sad or miserable. 0  I have been so unhappy that I have been crying. 0  The thought of harming myself has occurred to me. 0  Edinburgh Postnatal Depression Scale Total 2  After visit meds:  Allergies as of 03/18/2023   No Known Allergies      Medication List     STOP taking these medications    aspirin EC 81 MG tablet   ondansetron 8 MG disintegrating tablet Commonly known as: ZOFRAN-ODT   promethazine 25 MG tablet Commonly known as: PHENERGAN       TAKE these medications    carvedilol 6.25 MG tablet Commonly known as: COREG Take 1 tablet (6.25 mg total) by mouth 2 (two) times daily.   ibuprofen 600 MG tablet Commonly known as: ADVIL Take 1 tablet (600 mg total) by mouth every 6 (six) hours as needed for moderate pain or cramping.   omeprazole 20 MG capsule Commonly known as: PRILOSEC Take 20 mg by mouth daily.   PRE-NATAL PO Take by mouth.   sertraline 50 MG tablet Commonly known as: ZOLOFT Take 50 mg by mouth daily.         Discharge home in stable condition Infant Feeding: Breast Infant  Disposition:home with mother Discharge instruction: per After Visit Summary and Postpartum booklet. Activity: Advance as tolerated. Pelvic rest for 6 weeks.  Diet: routine diet Anticipated Birth Control: Unsure Postpartum Appointment:6 weeks Additional Postpartum F/U: Postpartum Depression checkup Future Appointments: Future Appointments  Date Time Provider Department Center  03/28/2023 11:00 AM Tobb, Lavona [place], DO [facility] None   Follow up Visit:  Follow-up Information     Ob/Gyn, [facility]amp. Schedule an appointment as soon as possible for a visit in 6 week(s).   Why: For postpartum visit Contact information: [address] [place] [address] [phone]                     03/18/2023 Cathrine Muster, DO

## 2023-03-25 ENCOUNTER — Telehealth (HOSPITAL_COMMUNITY): Payer: Self-pay

## 2023-03-25 NOTE — Telephone Encounter (Signed)
Patient did not answer phone call. Voicemail left for patient.   Suann Larry [facility] Perinatal Services   03/25/23,1210

## 2023-03-28 ENCOUNTER — Encounter: Payer: Self-pay | Admitting: Cardiology

## 2023-03-28 ENCOUNTER — Ambulatory Visit: Payer: BC Managed Care – PPO | Attending: Cardiology | Admitting: Cardiology

## 2023-03-28 VITALS — Ht 68.0 in | Wt 191.0 lb

## 2023-03-28 DIAGNOSIS — I10 Essential (primary) hypertension: Secondary | ICD-10-CM | POA: Diagnosis not present

## 2023-03-28 NOTE — Patient Instructions (Signed)
Medication Instructions:  Your physician recommends that you continue on your current medications as directed. Please refer to the Current Medication list given to you today.  *If you need a refill on your cardiac medications before your next appointment, please call your pharmacy*   Lab Work: None   Testing/Procedures: None   Follow-Up: At [facility], you and your health needs are our priority.  As part of our continuing mission to provide you with exceptional heart care, we have created designated Provider Care Teams.  These Care Teams include your primary Cardiologist (physician) and Advanced Practice Providers (APPs -  Physician Assistants and Nurse Practitioners) who all work together to provide you with the care you need, when you need it.  Your next appointment:   12 week(s)  Provider:   Kardie Tobb, DO  

## 2023-03-28 NOTE — Progress Notes (Signed)
Cardio-Obstetrics Clinic  Follow Up Note   Date:  03/28/2023   ID:  Alicia Nguyen, DOB 11-Jun-1986, MRN [id number]  PCP:  Dulce Sellar, NP   [facility] Providers Cardiologist:  Thomasene Ripple, DO  Electrophysiologist:  None        Referring MD: Dulce Sellar, NP   Chief Complaint: "I am ok"  History of Present Illness:    Alicia Nguyen is a 37 y.o. female [G3P3003] who returns for follow up for chronic hypertension in pregnancy.  She has a history of pre-eclampsia during her last pregnancy (delivered in August 2021). Also with a history of HTN, for which she took antihypertensives from 2014-2018, but this reportedly resolved after she lost a significant amount of weight.    At her initial cardio-ob visit on 03/15/21 her BP was 110/72.  Therefore no medication changes were made.   At her visit on June 05, 2019 she told me that she had been experiencing elevated blood pressure.  And this was concerning.  During that visit her blood pressure was elevated I therefore started the patient on nifedipine 30 mg daily.  In the interim her blood pressure still remain elevated so her OB/GYN increase her nifedipine to 60 mg daily.  She had lightheadedness with the full dose.   I saw the patient on August 03, 2021 at that time she had delivered and was postpartum.  During that visit her blood pressure was averaging in the 150s to 120.  She was on nifedipine 30 mg daily.  I did not change her medication.  She was doing well on the current dose of antihypertensive medication.   At her visit on October 15, 2021 she still was experiencing gestational hypertension.  We stopped the nifedipine and started valsartan.  Transvaginally the patient did not tolerate this medication she will therefore valsartan was stopped.  She has been on beta-blocker recommendation for making was to start carvedilol however the patient preferred Lopressor since she has been in the past.  In the interim she  called because her blood pressure was not well controlled.  I added hydrochlorothiazide.  At her visit in May 2023 she had been switched from Lopressor to Coreg.  She is doing well.  She is now pregnant [redacted] weeks.  She is having significant nausea and vomiting but no other complaints.  At her last visit September 29, 2022, at that time no medication changes were made.  She was doing well at her last visit.  Since I saw the patient she has delivered.  Her son Chrissie Noa is doing very well.   Prior CV Studies Reviewed: The following studies were reviewed today: TTE IMPRESSIONS 02/03/2021  1. Left ventricular ejection fraction, by estimation, is 60 to 65%. The left ventricle has normal function. The left ventricle has no regional wall motion abnormalities. Left ventricular diastolic parameters were normal.   2. Right ventricular systolic function is normal. The right ventricular size is normal.   3. The mitral valve is normal in structure. No evidence of mitral valve regurgitation. No evidence of mitral stenosis.   4. The aortic valve is normal in structure. Aortic valve regurgitation is not visualized. No aortic stenosis is present.   5. The inferior vena cava is normal in size with greater than 50% respiratory variability, suggesting right atrial pressure of 3 mmHg.   FINDINGS   Left Ventricle: Left ventricular ejection fraction, by estimation, is 60 to 65%. The left ventricle has normal function. The left ventricle has no  regional wall motion abnormalities. The left ventricular internal cavity size was normal in size. There is   no left ventricular hypertrophy. Left ventricular diastolic parameters  were normal.   Right Ventricle: The right ventricular size is normal. No increase in right ventricular wall thickness. Right ventricular systolic function is normal.   Left Atrium: Left atrial size was normal in size.   Right Atrium: Right atrial size was normal in size.   Pericardium: There is no  evidence of pericardial effusion.   Mitral Valve: The mitral valve is normal in structure. No evidence of mitral valve regurgitation. No evidence of mitral valve stenosis.   Tricuspid Valve: The tricuspid valve is normal in structure. Tricuspid valve regurgitation is not demonstrated. No evidence of tricuspid stenosis.   Aortic Valve: The aortic valve is normal in structure. Aortic valve regurgitation is not visualized. No aortic stenosis is present.   Pulmonic Valve: The pulmonic valve was normal in structure. Pulmonic valve regurgitation is not visualized. No evidence of pulmonic stenosis.   Aorta: The aortic root is normal in size and structure.   Venous: The inferior vena cava is normal in size with greater than 50% respiratory variability, suggesting right atrial pressure of 3 mmHg.   IAS/Shunts: No atrial level shunt detected by color flow Doppler.      [place] of lower extremities 02/15/2021 Examination Guidelines:  A complete evaluation includes B-mode imaging, spectral Doppler, color  Doppler,  and power Doppler as needed of all accessible portions of each vessel.  Bilateral  testing is considered an integral part of a complete examination. Limited examinations for reoccurring indications may be performed as noted. The  reflux  portion of the exam is performed with the patient in reverse  Trendelenburg.    Past Medical History:  Diagnosis Date   Acute bronchitis 11/06/2009   Qualifier: Diagnosis of  By: Gabriel Rung LPN, Harriett Sine     Advanced maternal age in multigravida, second trimester 03/15/2021   Anxiety 2010   Chronic hypertension affecting pregnancy 07/24/2021   Depression 11/14/2021   Post partum depression   Encounter for cardiovascular postpartum visit 08/03/2021   Gestational hypertension, third trimester 06/04/2021   History of pre-eclampsia 03/15/2021   Hypertension    Medication management 10/15/2021   Multigravida of advanced maternal age in third trimester  06/04/2021   Postpartum depression 05/30/2022   Preeclampsia    Pregnancy 01/06/2021   Pregnancy induced hypertension    Pregnant     Past Surgical History:  Procedure Laterality Date   BRAIN SURGERY  1989   Optic glioma removal   EYE SURGERY     Strabismus right eye   FOOT FRACTURE SURGERY     Optic gioma removal     REFRACTIVE SURGERY        OB History     Gravida  3   Para  3   Term  3   Preterm  0   AB  0   Living  3      SAB  0   IAB  0   Ectopic  0   Multiple  0   Live Births  3               Current Medications: Current Meds  Medication Sig   carvedilol (COREG) 6.25 MG tablet Take 1 tablet (6.25 mg total) by mouth 2 (two) times daily.   ibuprofen (ADVIL) 600 MG tablet Take 1 tablet (600 mg total) by mouth every 6 (six) hours as  needed for moderate pain or cramping.   omeprazole (PRILOSEC) 20 MG capsule Take 20 mg by mouth daily.   Prenatal Multivit-Min-Fe-FA (PRE-NATAL PO) Take by mouth.   sertraline (ZOLOFT) 50 MG tablet Take 50 mg by mouth daily.     Allergies:   Patient has no known allergies.   Social History   Socioeconomic History   Marital status: Married    Spouse name: Not on file   Number of children: 2   Years of education: Not on file   Highest education level: Not on file  Occupational History   Not on file  Tobacco Use   Smoking status: Never   Smokeless tobacco: Never  Vaping Use   Vaping Use: Never used  Substance and Sexual Activity   Alcohol use: Not Currently   Drug use: Never   Sexual activity: Yes    Birth control/protection: Pill  Other Topics Concern   Not on file  Social History Narrative   Not on file   Social Determinants of Health   Financial Resource Strain: Not on file  Food Insecurity: No Food Insecurity (03/16/2023)   Hunger Vital Sign    Worried About Running Out of Food in the Last Year: Never true    Ran Out of Food in the Last Year: Never true  Transportation Needs: No  Transportation Needs (03/16/2023)   PRAPARE - Administrator, Civil Service (Medical): No    Lack of Transportation (Non-Medical): No  Physical Activity: Not on file  Stress: Not on file  Social Connections: Not on file      Family History  Problem Relation Age of Onset   Diabetes Mother    Hypertension Mother    Miscarriages / [place] Mother    Skin cancer Father    Colon polyps Father    Hypertension Father    Diabetes Maternal Grandfather    Skin cancer Maternal Grandfather    Diabetes Paternal Grandmother    CVA Paternal Grandmother    Dementia Maternal Aunt    Cervical cancer Paternal Aunt    Colon cancer Neg Hx    Liver disease Neg Hx    Esophageal cancer Neg Hx    Stomach cancer Neg Hx       ROS:   Please see the history of present illness.     All other systems reviewed and are negative.   Labs/EKG Reviewed:    EKG:   EKG is was not ordered today.    Recent Labs: 03/30/2022: TSH 1.450 03/16/2023: ALT 11 03/17/2023: Hemoglobin 9.1; Platelets 259   Recent Lipid Panel No results found for: "CHOL", "TRIG", "HDL", "CHOLHDL", "LDLCALC", "LDLDIRECT"  Physical Exam:    VS:  Ht 5\' 8"  (1.727 m)   Wt 191 lb (86.6 kg)   LMP 06/14/2022   BMI 29.04 kg/m     Wt Readings from Last 3 Encounters:  03/28/23 191 lb (86.6 kg)  03/16/23 191 lb 8 oz (86.9 kg)  02/15/23 190 lb 6.4 oz (86.4 kg)     GEN:  Well nourished, well developed in no acute distress HEENT: Normal NECK: No JVD; No carotid bruits LYMPHATICS: No lymphadenopathy CARDIAC: RRR, no murmurs, rubs, gallops RESPIRATORY:  Clear to auscultation without rales, wheezing or rhonchi  ABDOMEN: Soft, non-tender, non-distended MUSCULOSKELETAL:  No edema; No deformity  SKIN: Warm and dry NEUROLOGIC:  Alert and oriented x 3 PSYCHIATRIC:  Normal affect    Risk Assessment/Risk Calculators:  ASSESSMENT & PLAN:    Chronic hypertension pregnancy Postpartum cardiovascular  visit   Blood pressure is acceptable we will keep the current dose of Coreg.  The patient is in agreement with the above plan. The patient left the office in stable condition.  The patient will follow up in 8 weeks or sooner if needed.  Patient Instructions  Medication Instructions:  Your physician recommends that you continue on your current medications as directed. Please refer to the Current Medication list given to you today.  *If you need a refill on your cardiac medications before your next appointment, please call your pharmacy*   Lab Work: None   Testing/Procedures: None   Follow-Up: At [facility], you and your health needs are our priority.  As part of our continuing mission to provide you with exceptional heart care, we have created designated Provider Care Teams.  These Care Teams include your primary Cardiologist (physician) and Advanced Practice Providers (APPs -  Physician Assistants and Nurse Practitioners) who all work together to provide you with the care you need, when you need it.  Your next appointment:   12 week(s)  Provider:   Thomasene Ripple, DO    Dispo:  No follow-ups on file.   Medication Adjustments/Labs and Tests Ordered: Current medicines are reviewed at length with the patient today.  Concerns regarding medicines are outlined above.  Tests Ordered: No orders of the defined types were placed in this encounter.  Medication Changes: No orders of the defined types were placed in this encounter.

## 2023-04-25 ENCOUNTER — Encounter: Payer: Self-pay | Admitting: Cardiology

## 2023-04-25 DIAGNOSIS — Z1331 Encounter for screening for depression: Secondary | ICD-10-CM | POA: Diagnosis not present

## 2023-04-26 MED ORDER — CARVEDILOL 3.125 MG PO TABS: 3.1250 mg | ORAL_TABLET | Freq: Two times a day (BID) | ORAL | 3 refills | Status: AC

## 2023-08-14 ENCOUNTER — Ambulatory Visit: Payer: BC Managed Care – PPO | Attending: Cardiology | Admitting: Cardiology

## 2023-12-14 DIAGNOSIS — Z03818 Encounter for observation for suspected exposure to other biological agents ruled out: Secondary | ICD-10-CM | POA: Diagnosis not present

## 2023-12-14 DIAGNOSIS — J101 Influenza due to other identified influenza virus with other respiratory manifestations: Secondary | ICD-10-CM | POA: Diagnosis not present

## 2023-12-14 DIAGNOSIS — J019 Acute sinusitis, unspecified: Secondary | ICD-10-CM | POA: Diagnosis not present

## 2024-01-12 DIAGNOSIS — J029 Acute pharyngitis, unspecified: Secondary | ICD-10-CM | POA: Diagnosis not present

## 2024-01-12 DIAGNOSIS — H9201 Otalgia, right ear: Secondary | ICD-10-CM | POA: Diagnosis not present

## 2024-01-12 DIAGNOSIS — H66001 Acute suppurative otitis media without spontaneous rupture of ear drum, right ear: Secondary | ICD-10-CM | POA: Diagnosis not present

## 2024-03-27 ENCOUNTER — Encounter: Payer: Self-pay | Admitting: Family

## 2024-03-27 ENCOUNTER — Ambulatory Visit (INDEPENDENT_AMBULATORY_CARE_PROVIDER_SITE_OTHER): Admitting: Family

## 2024-03-27 VITALS — BP 125/89 | HR 77 | Temp 97.9°F | Ht 68.0 in | Wt 192.2 lb

## 2024-03-27 DIAGNOSIS — I10 Essential (primary) hypertension: Secondary | ICD-10-CM

## 2024-03-27 DIAGNOSIS — Z13220 Encounter for screening for lipoid disorders: Secondary | ICD-10-CM | POA: Diagnosis not present

## 2024-03-27 DIAGNOSIS — M542 Cervicalgia: Secondary | ICD-10-CM | POA: Diagnosis not present

## 2024-03-27 DIAGNOSIS — D500 Iron deficiency anemia secondary to blood loss (chronic): Secondary | ICD-10-CM | POA: Diagnosis not present

## 2024-03-27 DIAGNOSIS — F419 Anxiety disorder, unspecified: Secondary | ICD-10-CM | POA: Insufficient documentation

## 2024-03-27 DIAGNOSIS — F32A Depression, unspecified: Secondary | ICD-10-CM

## 2024-03-27 LAB — CBC WITH DIFFERENTIAL/PLATELET
Basophils Absolute: 0 10*3/uL (ref 0.0–0.1)
Basophils Relative: 0.5 % (ref 0.0–3.0)
Eosinophils Absolute: 0.1 10*3/uL (ref 0.0–0.7)
Eosinophils Relative: 2.2 % (ref 0.0–5.0)
HCT: 40.5 % (ref 36.0–46.0)
Hemoglobin: 13.5 g/dL (ref 12.0–15.0)
Lymphocytes Relative: 28.9 % (ref 12.0–46.0)
Lymphs Abs: 1.8 10*3/uL (ref 0.7–4.0)
MCHC: 33.4 g/dL (ref 30.0–36.0)
MCV: 85.2 fl (ref 78.0–100.0)
Monocytes Absolute: 0.5 10*3/uL (ref 0.1–1.0)
Monocytes Relative: 7.7 % (ref 3.0–12.0)
Neutro Abs: 3.8 10*3/uL (ref 1.4–7.7)
Neutrophils Relative %: 60.7 % (ref 43.0–77.0)
Platelets: 314 10*3/uL (ref 150.0–400.0)
RBC: 4.75 Mil/uL (ref 3.87–5.11)
RDW: 14.1 % (ref 11.5–15.5)
WBC: 6.2 10*3/uL (ref 4.0–10.5)

## 2024-03-27 LAB — COMPREHENSIVE METABOLIC PANEL WITH GFR
ALT: 16 U/L (ref 0–35)
AST: 19 U/L (ref 0–37)
Albumin: 4.3 g/dL (ref 3.5–5.2)
Alkaline Phosphatase: 64 U/L (ref 39–117)
BUN: 14 mg/dL (ref 6–23)
CO2: 26 meq/L (ref 19–32)
Calcium: 9.4 mg/dL (ref 8.4–10.5)
Chloride: 104 meq/L (ref 96–112)
Creatinine, Ser: 0.76 mg/dL (ref 0.40–1.20)
GFR: 99.28 mL/min (ref >60.00)
Glucose, Bld: 86 mg/dL (ref 70–99)
Potassium: 4.2 meq/L (ref 3.5–5.1)
Sodium: 137 meq/L (ref 135–145)
Total Bilirubin: 0.3 mg/dL (ref 0.2–1.2)
Total Protein: 7.5 g/dL (ref 6.0–8.3)

## 2024-03-27 LAB — LIPID PANEL
Cholesterol: 191 mg/dL (ref 0–200)
HDL: 61.6 mg/dL (ref >39.00)
LDL Cholesterol: 108 mg/dL — ABNORMAL HIGH (ref 0–99)
NonHDL: 129.21
Total CHOL/HDL Ratio: 3
Triglycerides: 104 mg/dL (ref 0.0–149.0)
VLDL: 20.8 mg/dL (ref 0.0–40.0)

## 2024-03-27 NOTE — Assessment & Plan Note (Signed)
 Hypertension managed with carvedilol , taking every other day dosing. - Continue carvedilol  3.125mg  bid as prescribed. Ok to take qd, but qod is not giving good control on off days. - Monitor blood pressure a few times per week and notify office of readings >130/90. - F/U in 6 mos

## 2024-03-27 NOTE — Assessment & Plan Note (Signed)
 Symptoms managed with Zoloft  100mg  qd, prescribed by OB, but no longer seeing. - Continue Zoloft  as prescribed. - Notify pharmacy to switch RX refill to this office when refill needed. - F/U in 6 mos

## 2024-03-27 NOTE — Progress Notes (Deleted)
 Phone [phone]  Subjective:   Patient is a 38 y.o. female presenting for annual physical.    No chief complaint on file.   See problem oriented charting- ROS- full  review of systems was completed and negative except for what is noted in HPI above.  The following were reviewed and entered/updated in epic: Past Medical History:  Diagnosis Date   Acute bronchitis 11/06/2009   Qualifier: Diagnosis of  By: Ivy Marseilles LPN, Haskell Linker     Advanced maternal age in multigravida, second trimester 03/15/2021   Anxiety 2010   Chronic hypertension affecting pregnancy 07/24/2021   Depression 11/14/2021   Post partum depression   Encounter for cardiovascular postpartum visit 08/03/2021   Gestational hypertension, third trimester 06/04/2021   History of pre-eclampsia 03/15/2021   Hypertension    Medication management 10/15/2021   Multigravida of advanced maternal age in third trimester 06/04/2021   Postpartum depression 05/30/2022   Preeclampsia    Pregnancy 01/06/2021   Pregnancy induced hypertension    Pregnant    Patient Active Problem List   Diagnosis Date Noted   Chronic hypertension affecting pregnancy 03/16/2023   Essential hypertension 01/06/2021   Past Surgical History:  Procedure Laterality Date   BRAIN SURGERY  1989   Optic glioma removal   EYE SURGERY     Strabismus right eye   FOOT FRACTURE SURGERY     Optic gioma removal     REFRACTIVE SURGERY      Family History  Problem Relation Age of Onset   Diabetes Mother    Hypertension Mother    Miscarriages / [place] Mother    Skin cancer Father    Colon polyps Father    Hypertension Father    Diabetes Maternal Grandfather    Skin cancer Maternal Grandfather    Diabetes Paternal Grandmother    CVA Paternal Grandmother    Dementia Maternal Aunt    Cervical cancer Paternal Aunt    Colon cancer Neg Hx    Liver disease Neg Hx    Esophageal cancer Neg Hx    Stomach cancer Neg Hx     Medications- reviewed and  updated Current Outpatient Medications  Medication Sig Dispense Refill   carvedilol  (COREG ) 3.125 MG tablet Take 1 tablet (3.125 mg total) by mouth 2 (two) times daily. 180 tablet 3   ibuprofen  (ADVIL ) 600 MG tablet Take 1 tablet (600 mg total) by mouth every 6 (six) hours as needed for moderate pain or cramping. 40 tablet 0   omeprazole (PRILOSEC) 20 MG capsule Take 20 mg by mouth daily.     Prenatal Multivit-Min-Fe-FA (PRE-NATAL PO) Take by mouth.     sertraline  (ZOLOFT ) 50 MG tablet Take 50 mg by mouth daily.     No current facility-administered medications for this visit.    Allergies-reviewed and updated No Known Allergies  Social History   Social History Narrative   Not on file    Objective:  There were no vitals taken for this visit. Physical Exam Vitals and nursing note reviewed.  Constitutional:      Appearance: Normal appearance.  HENT:     Head: Normocephalic.     Right Ear: Tympanic membrane normal.     Left Ear: Tympanic membrane normal.     Nose: Nose normal.     Mouth/Throat:     Mouth: Mucous membranes are moist.  Eyes:     Pupils: Pupils are equal, round, and reactive to light.  Cardiovascular:     Rate and Rhythm: Normal  rate and regular rhythm.  Pulmonary:     Effort: Pulmonary effort is normal.     Breath sounds: Normal breath sounds.  Musculoskeletal:        General: Normal range of motion.     Cervical back: Normal range of motion.  Lymphadenopathy:     Cervical: No cervical adenopathy.  Skin:    General: Skin is warm and dry.  Neurological:     Mental Status: She is alert.  Psychiatric:        Mood and Affect: Mood normal.        Behavior: Behavior normal.       Assessment and Plan   Health Maintenance counseling: 1. Anticipatory guidance: Patient counseled regarding regular dental exams q6 months, eye exams,  avoiding smoking and second hand smoke, limiting alcohol to 1 beverage per day, no illicit drugs.   2. Risk factor reduction:   Advised patient of need for regular exercise and diet rich with fruits and vegetables to reduce risk of heart attack and stroke. Wt Readings from Last 3 Encounters:  03/28/23 191 lb (86.6 kg)  03/16/23 191 lb 8 oz (86.9 kg)  02/15/23 190 lb 6.4 oz (86.4 kg)   3. Immunizations/screenings/ancillary studies Immunization History  Administered Date(s) Administered   Influenza-Unspecified 07/31/2021   PFIZER Comirnaty(Gray Top)Covid-19 Tri-Sucrose Vaccine 07/03/2020, 07/24/2020   Td 10/31/2004   Tdap 05/19/2020   Health Maintenance Due  Topic Date Due   Cervical Cancer Screening (HPV/Pap Cotest)  08/20/2015    4. Cervical cancer screening: *** 5. Skin cancer screening- advised regular sunscreen use. Denies worrisome, changing, or new skin lesions.  6. Birth control/STD check: *** 7. Smoking associated screening: *** smoker 8. Alcohol screening: ***  There are no diagnoses linked to this encounter.  Recommended follow up: ***No follow-ups on file. Future Appointments  Date Time Provider Department Center  03/27/2024  9:30 AM Versa Gore, NP [facility]    Lab/Order associations:fasting    Versa Gore, NP

## 2024-03-27 NOTE — Progress Notes (Signed)
 Patient ID: Alicia Nguyen, female    DOB: 05-07-1986, 38 y.o.   MRN: [id number]  Chief Complaint  Patient presents with   Neck Pain    Pt c/o neck injury 2 years, Has tried tylenol  and naproxen  which does help slightly.    Leg Pain    Pt c/o right calf pain, present for 2 weeks.   Discussed the use of AI scribe software for clinical note transcription with the patient, who gave verbal consent to proceed.  History of Present Illness Alicia Nguyen "Alicia Nguyen" is a 38 year old female who presents for a follow up and management of leg pain and neck discomfort.  She has experienced right leg pain for two weeks after hearing a 'pop' while descending stairs. The pain is localized to her upper right calf, tender to touch, and worsens with walking or toe flexion. There is a sensation of swelling, but no significant swelling, erythema or bruising is observed. She has 3 young children and has not been able to take time to treat with ice, heat or OTC pain relievers.  Neck discomfort persists, initially suspected to be ear-related, but ruled out by ENT over a year ago. The pain worsens when carrying her children, especially on the left side, as she is left-handed. She takes Zoloft  for anxiety and depression worsened postpartum, given by her OB. She tolerates the med well, and current dose controls her sx. She also takes carvedilol  prescribed during pregnancy and taken every other day. Her blood pressure remains stable on this regimen. Menstrual cycles are regular but very heavy, with a history of anemia during her last pregnancy. She is concerned about her iron levels due to poor recent dietary intake. She has not had a lipid panel in a couple of years and was not fasting prior to this visit, having consumed coffee with cream and sugar and a bite of eggs this morning.  Assessment & Plan Leg pain - Acute right calf pain post-popping sensation, likely muscle strain or minor soft tissue injury. Unlikely clot or  severe ligament injury. - Advise heat application during day with OTC heat patches or at night with heating pad for up to 30 minutes. - Recommend OTC topical analgesics such as Tiger Balm or Biofreeze for circulation and healing. - Avoid exercises that could worsen pain. - Instruct to report if symptoms persist or worsen.  Neck pain - Chronic muscular left sided neck pain, exacerbated by carrying children and heavy items on left shoulder, possibly due to repetitive left arm/shoulder use, pt is left handed. - Refer to physical therapy for evaluation and treatment. - Advise alternating shoulders when carrying children/equipment. - Perform neck stretches, ROM exercises during day to relieve tension. - Recommend topical heat up to 30 minutes tid, or topical analgesic creams tid for relief. - F/U prn  Anemia/Heavy menstrual bleeding - Heavy menstrual bleeding with regular cycles since giving birth a year ago, potential anemia due to blood loss & possible insufficient dietary iron. - Order complete blood count for anemia assessment. - Encourage iron-rich diet. - Consider iron supplementation during menstrual cycles if anemia present.  Hypertension - Hypertension managed with carvedilol , taking every other day dosing. - Continue carvedilol  3.125mg  bid as prescribed. Ok to take qd, but qod is not giving good control on off days. - Monitor blood pressure a few times per week and notify office of readings >130/90. - F/U in 6 mos  Anxiety & Depression - Symptoms managed with Zoloft  100mg  qd, prescribed by  OB, but no longer seeing. - Continue Zoloft  as prescribed. - Notify pharmacy to switch RX refill to this office when refill needed. - F/U in 6 mos  General Health Maintenance Discussion on blood work frequency for health maintenance. Non-fasting lipid panel considered. - Order lipid panel for cholesterol assessment.   Subjective:     Outpatient Medications Prior to Visit  Medication  Sig Dispense Refill   carvedilol  (COREG ) 3.125 MG tablet Take 1 tablet (3.125 mg total) by mouth 2 (two) times daily. 180 tablet 3   sertraline  (ZOLOFT ) 100 MG tablet Take 50 mg by mouth daily.     ibuprofen  (ADVIL ) 600 MG tablet Take 1 tablet (600 mg total) by mouth every 6 (six) hours as needed for moderate pain or cramping. (Patient not taking: Reported on 03/27/2024) 40 tablet 0   omeprazole (PRILOSEC) 20 MG capsule Take 20 mg by mouth daily. (Patient not taking: Reported on 03/27/2024)     Prenatal Multivit-Min-Fe-FA (PRE-NATAL PO) Take by mouth. (Patient not taking: Reported on 03/27/2024)     No facility-administered medications prior to visit.   Past Medical History:  Diagnosis Date   Acute bronchitis 11/06/2009   Qualifier: Diagnosis of  By: Ivy Marseilles LPN, Haskell Linker     Advanced maternal age in multigravida, second trimester 03/15/2021   Anxiety 2010   Chronic hypertension affecting pregnancy 07/24/2021   Depression 11/14/2021   Post partum depression   Encounter for cardiovascular postpartum visit 08/03/2021   Gestational hypertension, third trimester 06/04/2021   History of pre-eclampsia 03/15/2021   Hypertension    Medication management 10/15/2021   Multigravida of advanced maternal age in third trimester 06/04/2021   Postpartum depression 05/30/2022   Preeclampsia    Pregnancy 01/06/2021   Pregnancy induced hypertension    Pregnant    Past Surgical History:  Procedure Laterality Date   BRAIN SURGERY  1989   Optic glioma removal   EYE SURGERY     Strabismus right eye   FOOT FRACTURE SURGERY     Optic gioma removal     REFRACTIVE SURGERY     No Known Allergies    Objective:    Physical Exam Vitals and nursing note reviewed.  Constitutional:      Appearance: Normal appearance.  Cardiovascular:     Rate and Rhythm: Normal rate and regular rhythm.  Pulmonary:     Effort: Pulmonary effort is normal.     Breath sounds: Normal breath sounds.  Musculoskeletal:      Cervical back: Pain with movement and muscular tenderness (left side) present. Normal range of motion.     Right lower leg: Tenderness (mild w/palpation) present. No swelling or bony tenderness.  Lymphadenopathy:     Cervical: No cervical adenopathy.  Skin:    General: Skin is warm and dry.  Neurological:     Mental Status: She is alert.  Psychiatric:        Mood and Affect: Mood normal.        Behavior: Behavior normal.    BP 125/89 (BP Location: Left Arm, Patient Position: Sitting, Cuff Size: Large)   Pulse 77   Temp 97.9 F (36.6 C) (Temporal)   Ht 5\' 8"  (1.727 m)   Wt 192 lb 4 oz (87.2 kg)   LMP 03/12/2024 (Exact Date)   SpO2 98%   Breastfeeding No   BMI 29.23 kg/m  Wt Readings from Last 3 Encounters:  03/27/24 192 lb 4 oz (87.2 kg)  03/28/23 191 lb (86.6 kg)  03/16/23 191  lb 8 oz (86.9 kg)      Versa Gore, NP

## 2024-03-27 NOTE — Patient Instructions (Signed)
 It was very nice to see you today!   I will review your lab results via MyChart in a few days.  You look great! Stay well! Have a great week!     PLEASE NOTE:  If you had any lab tests please let us  know if you have not heard back within a few days. You may see your results on MyChart before we have a chance to review them but we will give you a call once they are reviewed by us . If we ordered any referrals today, please let us  know if you have not heard from their office within the next week.

## 2024-04-01 ENCOUNTER — Ambulatory Visit: Payer: Self-pay | Admitting: Family

## 2024-04-02 ENCOUNTER — Other Ambulatory Visit: Payer: Self-pay | Admitting: Cardiology

## 2024-04-22 NOTE — Therapy (Unsigned)
 OUTPATIENT PHYSICAL THERAPY CERVICAL EVALUATION   Patient Name: Alicia Nguyen MRN: [id number] DOB:05-02-1986, 38 y.o., female Today's Date: 04/23/2024  END OF SESSION:  PT End of Session - 04/23/24 0933     Visit Number 1    Number of Visits 16    Date for PT Re-Evaluation 07/16/24    Authorization Type VL 50/50 PA required After 50 visits    Authorization - Visit Number 1    Authorization - Number of Visits 50    PT Start Time 0935    PT Stop Time 1014    PT Time Calculation (min) 39 min    Activity Tolerance Patient tolerated treatment well    Behavior During Therapy [facility] for tasks assessed/performed          Past Medical History:  Diagnosis Date   Acute bronchitis 11/06/2009   Qualifier: Diagnosis of  By: Lavinia LPN, Inocente     Advanced maternal age in multigravida, second trimester 03/15/2021   Anxiety 2010   Chronic hypertension affecting pregnancy 07/24/2021   Depression 11/14/2021   Post partum depression   Encounter for cardiovascular postpartum visit 08/03/2021   Gestational hypertension, third trimester 06/04/2021   History of pre-eclampsia 03/15/2021   Hypertension    Medication management 10/15/2021   Multigravida of advanced maternal age in third trimester 06/04/2021   Postpartum depression 05/30/2022   Preeclampsia    Pregnancy 01/06/2021   Pregnancy induced hypertension    Pregnant    Past Surgical History:  Procedure Laterality Date   BRAIN SURGERY  1989   Optic glioma removal   EYE SURGERY     Strabismus right eye   FOOT FRACTURE SURGERY     Optic gioma removal     REFRACTIVE SURGERY     Patient Active Problem List   Diagnosis Date Noted   Iron deficiency anemia due to chronic blood loss 03/27/2024   Anxiety and depression 03/27/2024   Chronic hypertension affecting pregnancy 03/16/2023   Essential (primary) hypertension 01/06/2021    PCP: Lucius Krabbe, NP  REFERRING PROVIDER: Lucius Krabbe, NP  REFERRING DIAG: M54.2  (ICD-10-CM) - Cervicalgia  THERAPY DIAG:  Cervicalgia  Muscle weakness (generalized)  Rationale for Evaluation and Treatment: Rehabilitation  ONSET DATE: 2 years  SUBJECTIVE:                                                                                                                                                                                                         SUBJECTIVE STATEMENT: Patient reports that she has left-sided neck and arm symptoms as  well as a constant headache on the left side of her head.  Reports that she primarily uses her left arm to take care of her 3 children who are under 69 years old.  Reports sleeping is terrible and she is a belly sleeper but does not know of another position that is comfortable for her.  Reports it is also difficult and painful to lift her children as it constantly pulls on her neck.  Reports stretching does feel better but she is constantly manipulating her head and neck into different positions.  Reports she tries to go for walk but does not do any other regular exercises.  Reports she has occasional numbness in her left hand along her pinky and ring finger.    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 Yes: NPRS scale: 5/10 constant 8/10 at worse Pain location: left side  Pain description: ache, constant Aggravating factors: carrying children on left side Relieving factors: tylenol , naproxen , stretching  PRECAUTIONS: None  RED FLAGS: None     WEIGHT BEARING RESTRICTIONS: No  FALLS:  Has patient fallen in last 6 months? 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  DIAGNOSTIC FINDINGS:  No recent imaging   PATIENT SURVEYS:  Patient-specific  activity functional scoring scheme (Point to one number):  0 represents "unable to perform." 10 represents "able to perform at prior level. 0 1 2 3 4 5 6 7 8 9  10 (Date and Score) Activity Initial  Activity Eval     sleeping 3     Carrying children  5     Driving checking blinds spots 4    Additional Additional Total score = sum of the activity scores/number of activities Minimum detectable change (90%CI) for average score = 2 points Minimum detectable change (90%CI) for single activity score = 3 points PSFS developed by: Rosalee MYRTIS Marvis KYM Charlet CHRISTELLA., & Binkley, J. (1995). Assessing disability and change on individual  patients: a report of a patient specific measure. Physiotherapy [place], 47, [phone]. Reproduced with the permission of the authors  Score: eval: 12/3=4   COGNITION: Overall cognitive status: Within functional limits for tasks assessed  SENSATION: Not tested  POSTURE: rounded shoulders and forward head, scapular retracted at rest  PALPATION: Increased pain and resting tone along left cervical and shoulder musculature compared to right side   CERVICAL ROM: WNL all directions but pain with R SB and L ROT      UE Measurements Upper Extremity Right EVAL Left EVAL   A/PROM MMT A/PROM MMT  Shoulder Flexion WFL 4 WLF* 4*  Shoulder Extension [facility]  WFL*   Shoulder Abduction  4  4*  Shoulder Adduction      Shoulder Internal Rotation [facility]  [facility]*   Shoulder External Rotation [facility]  WFL*   Elbow Flexion      Elbow Extension      Wrist Flexion      Wrist Extension      Wrist Supination      Wrist Pronation      Wrist Ulnar Deviation      Wrist Radial Deviation      Grip Strength NA  NA     (Blank rows =  not tested)   * pain   CERVICAL SPECIAL TESTS:  Neg spurling's  FUNCTIONAL TESTS:  Long exhale breathing- lifts head and primarily uses cervical musculature.   TREATMENT DATE:                                                                                                                                 04/23/2024  Therapeutic Exercise:  Aerobic: Supine: Self mobilization with tennis ball was to suboccipital region tolerated well Prone:  Seated:  Standing: Neuromuscular Re-education: Exhale breathing with focus on zipper and core and lower rib depression 6 minutes total with practice and tactile cues, educated on posture and positioning while at home, bobble head Chin tuck with focus on postural positioning to reduce stress on neck Manual Therapy: Cervical traction and STM to upper cervical muscles tolerated well Therapeutic Activity: Self Care: Trigger Point Dry Needling:  Modalities:    PATIENT EDUCATION:  Education details: on current presentation, on HEP, on clinical outcomes score and POC Person educated: Patient Education method: Programmer, multimedia, Demonstration, and Handouts Education comprehension: verbalized understanding   HOME EXERCISE PROGRAM: No medbridge  Long exhale breathing Cervical traction device Tennis balls to upper cervical Chin tuck  ASSESSMENT:  CLINICAL IMPRESSION: Patient presents to physical therapy with complaints of left-sided neck and's that are worse with holding her children.  Patient also presents with right visual field deficits which reduces the likelihood of her using her right upper extremity and therefore she favors the left side of her body.  Patient presents with general instability and weakness that are greatly contributing to current presentation.  Educated patient and current presentation as well as plan moving forward.  Responded well to initial interventions and would greatly benefit from skilled PT to address physical impairments and improve overall function and quality of life.  OBJECTIVE IMPAIRMENTS: decreased activity tolerance, decreased mobility, decreased ROM, decreased strength, improper body mechanics, postural dysfunction, and pain.   ACTIVITY LIMITATIONS: carrying,  lifting, sleeping, and reach over head  PARTICIPATION LIMITATIONS: meal prep, cleaning, laundry, driving, and occupation  PERSONAL FACTORS: Fitness and 1-2 comorbidities: right eye blindness, x3 pregnancy's with pelvic floor issue during pregnancy are also affecting patient's functional outcome.   REHAB POTENTIAL: Good  CLINICAL DECISION MAKING: Stable/uncomplicated  EVALUATION COMPLEXITY: Low   GOALS: Goals reviewed with patient? yes  SHORT TERM GOALS: Target date: 06/04/2024   Patient will be independent in self management strategies to improve quality of life and functional outcomes. Baseline: New Program Goal status: INITIAL  2.  Patient will report at least 50% improvement in overall symptoms and/or function to demonstrate improved functional mobility Baseline: 0% better Goal status: INITIAL  3.  Patient will demonstrate painfree Cervical ROM in all directions  Baseline: painful Goal status: INITIAL       LONG TERM GOALS: Target date: 07/16/2024    Patient will report at least 75% improvement in overall symptoms and/or function to demonstrate improved functional  mobility Baseline: 0% better Goal status: INITIAL  2.  Patient will score at least 2 points higher on PSFS average to demonstrate change in overall function. Baseline: see above Goal status: INITIAL  3.  Patient will be able to lift her children without pain in her neck to improve ability to take care of her kids at home Baseline: Painful Goal status: INITIAL  4.  Patient will perform regular stability exercises at least 4 times a week to improve postural strength and overall stability Baseline: Not currently Goal status: INITIAL    PLAN:  PT FREQUENCY: 1-2x/week for a total of 16 visits over 12 week certification period  PT DURATION: 12 weeks  PLANNED INTERVENTIONS: 97110-Therapeutic exercises, 97530- Therapeutic activity, 97112- Neuromuscular re-education, 97535- Self Care, 02859- Manual therapy,  [phone]- Gait training, [phone]- Orthotic Fit/training, [phone]- Canalith repositioning, [id number]- Aquatic Therapy, 97014- Electrical stimulation (unattended), [phone]- Ionotophoresis 4mg /ml Dexamethasone, Patient/Family education, Balance training, Stair training, Taping, Dry Needling, Joint mobilization, Joint manipulation, Spinal manipulation, Spinal mobilization, Cryotherapy, and Moist heat   PLAN FOR NEXT SESSION: stability exercises of neck/shoulder, core activation with breath work/positions, neck isometrics, quadruped exercises, thoracic mobilization, manual interventions for pain relief and self mobilization strategies    11:55 AM, 04/23/24 Olivia Church, DPT Physical Therapy with [place]

## 2024-04-23 ENCOUNTER — Encounter: Payer: Self-pay | Admitting: Physical Therapy

## 2024-04-23 ENCOUNTER — Ambulatory Visit (INDEPENDENT_AMBULATORY_CARE_PROVIDER_SITE_OTHER): Admitting: Physical Therapy

## 2024-04-23 DIAGNOSIS — M6281 Muscle weakness (generalized): Secondary | ICD-10-CM

## 2024-04-23 DIAGNOSIS — M542 Cervicalgia: Secondary | ICD-10-CM | POA: Diagnosis not present

## 2024-05-07 ENCOUNTER — Encounter: Payer: Self-pay | Admitting: Physical Therapy

## 2024-05-07 ENCOUNTER — Encounter: Payer: Self-pay | Admitting: Family

## 2024-05-07 ENCOUNTER — Ambulatory Visit (INDEPENDENT_AMBULATORY_CARE_PROVIDER_SITE_OTHER): Admitting: Physical Therapy

## 2024-05-07 DIAGNOSIS — M542 Cervicalgia: Secondary | ICD-10-CM | POA: Diagnosis not present

## 2024-05-07 DIAGNOSIS — M6281 Muscle weakness (generalized): Secondary | ICD-10-CM | POA: Diagnosis not present

## 2024-05-07 DIAGNOSIS — M62838 Other muscle spasm: Secondary | ICD-10-CM | POA: Diagnosis not present

## 2024-05-07 DIAGNOSIS — Z01419 Encounter for gynecological examination (general) (routine) without abnormal findings: Secondary | ICD-10-CM | POA: Diagnosis not present

## 2024-05-07 DIAGNOSIS — Z8742 Personal history of other diseases of the female genital tract: Secondary | ICD-10-CM | POA: Diagnosis not present

## 2024-05-07 DIAGNOSIS — R293 Abnormal posture: Secondary | ICD-10-CM | POA: Diagnosis not present

## 2024-05-07 NOTE — Therapy (Signed)
 OUTPATIENT PHYSICAL THERAPY CERVICAL TREATMENT    Patient Name: Alicia Nguyen MRN: [id number] DOB:02/02/1986, 38 y.o., female Today's Date: 05/07/2024  END OF SESSION:  PT End of Session - 05/07/24 1430     Visit Number 2    Number of Visits 16    Date for PT Re-Evaluation 07/16/24    Authorization Type VL 50/50 PA required After 50 visits    Authorization - Visit Number 2    Authorization - Number of Visits 50    PT Start Time 1431    PT Stop Time 1510    PT Time Calculation (min) 39 min    Activity Tolerance Patient tolerated treatment well    Behavior During Therapy [facility] for tasks assessed/performed           Past Medical History:  Diagnosis Date   Acute bronchitis 11/06/2009   Qualifier: Diagnosis of  By: Lavinia LPN, Inocente     Advanced maternal age in multigravida, second trimester 03/15/2021   Anxiety 2010   Chronic hypertension affecting pregnancy 07/24/2021   Depression 11/14/2021   Post partum depression   Encounter for cardiovascular postpartum visit 08/03/2021   Gestational hypertension, third trimester 06/04/2021   History of pre-eclampsia 03/15/2021   Hypertension    Medication management 10/15/2021   Multigravida of advanced maternal age in third trimester 06/04/2021   Postpartum depression 05/30/2022   Preeclampsia    Pregnancy 01/06/2021   Pregnancy induced hypertension    Pregnant    Past Surgical History:  Procedure Laterality Date   BRAIN SURGERY  1989   Optic glioma removal   EYE SURGERY     Strabismus right eye   FOOT FRACTURE SURGERY     Optic gioma removal     REFRACTIVE SURGERY     Patient Active Problem List   Diagnosis Date Noted   Iron deficiency anemia due to chronic blood loss 03/27/2024   Anxiety and depression 03/27/2024   Chronic hypertension affecting pregnancy 03/16/2023   Essential (primary) hypertension 01/06/2021    PCP: Lucius Krabbe, NP  REFERRING PROVIDER: Lucius Krabbe, NP  REFERRING DIAG: M54.2  (ICD-10-CM) - Cervicalgia  THERAPY DIAG:  Cervicalgia  Muscle weakness (generalized)  Abnormal posture  Other muscle spasm  Rationale for Evaluation and Treatment: Rehabilitation  ONSET DATE: 2 years  SUBJECTIVE:                                                                                                                                                                                                         SUBJECTIVE STATEMENT: 05/07/2024 Still  having consistent headache. States she has increased soreness after last session waking up muscles.   EVAL:Patient reports that she has left-sided neck and arm symptoms as well as a constant headache on the left side of her head.  Reports that she primarily uses her left arm to take care of her 3 children who are under 23 years old.  Reports sleeping is terrible and she is a belly sleeper but does not know of another position that is comfortable for her.  Reports it is also difficult and painful to lift her children as it constantly pulls on her neck.  Reports stretching does feel better but she is constantly manipulating her head and neck into different positions.  Reports she tries to go for walk but does not do any other regular exercises.  Reports she has occasional numbness in her left hand along her pinky and ring finger.    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 Yes: NPRS scale: 4/10  Pain location: left side  Pain description: ache, constant Aggravating factors: carrying children on left side Relieving factors: tylenol , naproxen , stretching  PRECAUTIONS: None  RED FLAGS: None     WEIGHT BEARING RESTRICTIONS: No  FALLS:  Has patient fallen in last 6 months? 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  DIAGNOSTIC FINDINGS:  No recent imaging   PATIENT SURVEYS:  Patient-specific activity functional scoring scheme (Point to one number):  0 represents "unable to perform." 10 represents "able to perform at prior level. 0 1 2 3 4 5 6 7 8 9  10 (Date and Score) Activity Initial  Activity Eval     sleeping 3     Carrying children  5     Driving checking blinds spots 4    Additional Additional Total score = sum of the activity scores/number of activities Minimum detectable change (90%CI) for average score = 2 points Minimum detectable change (90%CI) for single activity score = 3 points PSFS developed by: Rosalee MYRTIS Marvis KYM Charlet CHRISTELLA., & Binkley, J. (1995). Assessing disability and change on individual  patients: a report of a patient specific measure. Physiotherapy [place], 47, [phone]. Reproduced with the permission of the authors  Score: eval: 12/3=4   COGNITION: Overall cognitive status: Within functional limits for tasks assessed  SENSATION: Not tested  POSTURE: rounded shoulders and forward head, scapular retracted at rest  PALPATION: Increased pain and resting tone along left cervical and shoulder musculature compared to right side   CERVICAL ROM: WNL all directions but pain with R SB and L ROT      UE Measurements Upper Extremity Right EVAL Left EVAL   A/PROM MMT A/PROM MMT  Shoulder Flexion WFL 4 WLF* 4*  Shoulder Extension [facility]  [facility]*   Shoulder Abduction  4  4*  Shoulder Adduction      Shoulder Internal Rotation [facility]  [facility]*   Shoulder External Rotation [facility]  WFL*   Elbow Flexion      Elbow Extension      Wrist Flexion      Wrist Extension      Wrist Supination      Wrist Pronation      Wrist Ulnar Deviation  Wrist Radial Deviation      Grip Strength NA  NA     (Blank rows = not tested)   * pain   CERVICAL SPECIAL TESTS:  Neg spurling's  FUNCTIONAL TESTS:  Long exhale  breathing- lifts head and primarily uses cervical musculature.   TREATMENT DATE:                                                                                                                                05/07/2024  Therapeutic Exercise:  Reviewed HEP  Supine:   Prone:  Seated:  Standing: Neuromuscular Re-education: chin tuck with and without scapular protraction in supine - tolerated well - posterior stretch noted - 12 minutes Manual Therapy: Cervical traction and STM to upper cervical muscles, anterior scalenes/SCM/orbital and other facial/jaw muscles - tolerated well. Joint mobilizations to cervical spine medial glides gradeII/III Therapeutic Activity: Self Care: Trigger Point Dry Needling:  Modalities:    PATIENT EDUCATION:  Education details: on HEP Person educated: Patient Education method: Programmer, multimedia, Facilities manager, and Handouts Education comprehension: verbalized understanding   HOME EXERCISE PROGRAM: No medbridge  Long exhale breathing Cervical traction device Tennis balls to upper cervical Chin tuck  ASSESSMENT:  CLINICAL IMPRESSION: 05/07/2024 Session focused on manual interventions and this significantly reduced migraine/headache symptoms. Educated patient in self massage to face and neck muscles contributing to current symptoms. Added serratus anterior exercise to HEP. Will continue with current POC as tolerated.   EVAL:Patient presents to physical therapy with complaints of left-sided neck and's that are worse with holding her children.  Patient also presents with right visual field deficits which reduces the likelihood of her using her right upper extremity and therefore she favors the left side of her body.  Patient presents with general instability and weakness that are greatly contributing to current presentation.  Educated patient and current presentation as well as plan moving forward.  Responded well to initial interventions and would greatly  benefit from skilled PT to address physical impairments and improve overall function and quality of life.  OBJECTIVE IMPAIRMENTS: decreased activity tolerance, decreased mobility, decreased ROM, decreased strength, improper body mechanics, postural dysfunction, and pain.   ACTIVITY LIMITATIONS: carrying, lifting, sleeping, and reach over head  PARTICIPATION LIMITATIONS: meal prep, cleaning, laundry, driving, and occupation  PERSONAL FACTORS: Fitness and 1-2 comorbidities: right eye blindness, x3 pregnancy's with pelvic floor issue during pregnancy are also affecting patient's functional outcome.   REHAB POTENTIAL: Good  CLINICAL DECISION MAKING: Stable/uncomplicated  EVALUATION COMPLEXITY: Low   GOALS: Goals reviewed with patient? yes  SHORT TERM GOALS: Target date: 06/04/2024   Patient will be independent in self management strategies to improve quality of life and functional outcomes. Baseline: New Program Goal status: INITIAL  2.  Patient will report at least 50% improvement in overall symptoms and/or function to demonstrate improved functional mobility Baseline: 0% better Goal status: INITIAL  3.  Patient will demonstrate painfree Cervical ROM in all  directions  Baseline: painful Goal status: INITIAL       LONG TERM GOALS: Target date: 07/16/2024    Patient will report at least 75% improvement in overall symptoms and/or function to demonstrate improved functional mobility Baseline: 0% better Goal status: INITIAL  2.  Patient will score at least 2 points higher on PSFS average to demonstrate change in overall function. Baseline: see above Goal status: INITIAL  3.  Patient will be able to lift her children without pain in her neck to improve ability to take care of her kids at home Baseline: Painful Goal status: INITIAL  4.  Patient will perform regular stability exercises at least 4 times a week to improve postural strength and overall stability Baseline: Not  currently Goal status: INITIAL    PLAN:  PT FREQUENCY: 1-2x/week for a total of 16 visits over 12 week certification period  PT DURATION: 12 weeks  PLANNED INTERVENTIONS: 97110-Therapeutic exercises, 97530- Therapeutic activity, 97112- Neuromuscular re-education, 97535- Self Care, 02859- Manual therapy, [phone]- Gait training, [phone]- Orthotic Fit/training, [phone]- Canalith repositioning, [id number]- Aquatic Therapy, 97014- Electrical stimulation (unattended), [phone]- Ionotophoresis 4mg /ml Dexamethasone, Patient/Family education, Balance training, Stair training, Taping, Dry Needling, Joint mobilization, Joint manipulation, Spinal manipulation, Spinal mobilization, Cryotherapy, and Moist heat   PLAN FOR NEXT SESSION:  Right eye blindness - sit to left - right sided strengthening   heat/cervical traction/STM and neck mobilizations for headache symptoms   stability exercises of neck/shoulder, core activation with breath work/positions, neck isometrics, quadruped exercises, thoracic mobilization, manual interventions for pain relief and self mobilization strategies    3:17 PM, 05/07/24 Olivia Church, DPT Physical Therapy with [place]

## 2024-05-08 ENCOUNTER — Other Ambulatory Visit: Payer: Self-pay

## 2024-05-08 DIAGNOSIS — M26622 Arthralgia of left temporomandibular joint: Secondary | ICD-10-CM

## 2024-05-08 MED ORDER — CYCLOBENZAPRINE HCL 5 MG PO TABS
5.0000 mg | ORAL_TABLET | ORAL | 0 refills | Status: AC | PRN
Start: 2024-05-08 — End: ?

## 2024-05-08 NOTE — Telephone Encounter (Signed)
 yes, no refills, thx

## 2024-05-08 NOTE — Telephone Encounter (Signed)
 Completed.

## 2024-05-16 ENCOUNTER — Encounter: Admitting: Physical Therapy

## 2024-05-23 ENCOUNTER — Encounter: Admitting: Physical Therapy

## 2024-05-30 ENCOUNTER — Encounter: Admitting: Physical Therapy

## 2024-07-22 DIAGNOSIS — L814 Other melanin hyperpigmentation: Secondary | ICD-10-CM | POA: Diagnosis not present

## 2024-07-22 DIAGNOSIS — L578 Other skin changes due to chronic exposure to nonionizing radiation: Secondary | ICD-10-CM | POA: Diagnosis not present

## 2024-07-22 DIAGNOSIS — L821 Other seborrheic keratosis: Secondary | ICD-10-CM | POA: Diagnosis not present

## 2024-07-22 DIAGNOSIS — D229 Melanocytic nevi, unspecified: Secondary | ICD-10-CM | POA: Diagnosis not present

## 2024-07-22 DIAGNOSIS — L820 Inflamed seborrheic keratosis: Secondary | ICD-10-CM | POA: Diagnosis not present

## 2024-09-20 DIAGNOSIS — N760 Acute vaginitis: Secondary | ICD-10-CM | POA: Diagnosis not present

## 2024-09-20 DIAGNOSIS — B3731 Acute candidiasis of vulva and vagina: Secondary | ICD-10-CM | POA: Diagnosis not present

## 2024-09-20 DIAGNOSIS — N898 Other specified noninflammatory disorders of vagina: Secondary | ICD-10-CM | POA: Diagnosis not present

## 2024-09-20 DIAGNOSIS — R10A0 Flank pain, unspecified side: Secondary | ICD-10-CM | POA: Diagnosis not present

## 2025-03-31 ENCOUNTER — Encounter: Admitting: Family
